# Patient Record
Sex: Female | Born: 1950
Health system: Southern US, Community
[De-identification: ages and names within clinical notes are randomized; demographics above are authoritative.]

## PROBLEM LIST (undated history)

## (undated) DIAGNOSIS — Z9289 Personal history of other medical treatment: Secondary | ICD-10-CM

## (undated) DIAGNOSIS — J45909 Unspecified asthma, uncomplicated: Secondary | ICD-10-CM

## (undated) DIAGNOSIS — N63 Unspecified lump in unspecified breast: Secondary | ICD-10-CM

## (undated) DIAGNOSIS — Z136 Encounter for screening for cardiovascular disorders: Secondary | ICD-10-CM

## (undated) DIAGNOSIS — I2510 Atherosclerotic heart disease of native coronary artery without angina pectoris: Secondary | ICD-10-CM

## (undated) DIAGNOSIS — J42 Unspecified chronic bronchitis: Secondary | ICD-10-CM

## (undated) HISTORY — DX: Encounter for screening for cardiovascular disorders: Z13.6

## (undated) HISTORY — PX: TUBAL LIGATION: SHX77

## (undated) HISTORY — DX: Personal history of other medical treatment: Z92.89

## (undated) HISTORY — DX: Atherosclerotic heart disease of native coronary artery without angina pectoris: I25.10

## (undated) HISTORY — DX: Unspecified lump in unspecified breast: N63.0

## (undated) HISTORY — DX: Unspecified asthma, uncomplicated: J45.909

---

## 2002-06-12 HISTORY — PX: CHOLECYSTECTOMY: SHX55

## 2004-04-21 ENCOUNTER — Ambulatory Visit: Payer: Self-pay | Admitting: Gastroenterology

## 2004-05-30 ENCOUNTER — Ambulatory Visit: Payer: Self-pay | Admitting: Chiropractic Medicine

## 2005-03-07 ENCOUNTER — Ambulatory Visit: Payer: Self-pay | Admitting: General Practice

## 2005-06-12 HISTORY — PX: SPINE SURGERY: SHX786

## 2005-06-28 ENCOUNTER — Encounter: Admission: RE | Admit: 2005-06-28 | Discharge: 2005-06-28 | Payer: Self-pay | Admitting: Neurosurgery

## 2005-07-13 ENCOUNTER — Encounter: Admission: RE | Admit: 2005-07-13 | Discharge: 2005-07-13 | Payer: Self-pay | Admitting: Neurosurgery

## 2005-08-04 ENCOUNTER — Ambulatory Visit: Payer: Self-pay | Admitting: Unknown Physician Specialty

## 2005-08-11 ENCOUNTER — Ambulatory Visit: Payer: Self-pay | Admitting: Surgery

## 2005-09-06 ENCOUNTER — Other Ambulatory Visit: Payer: Self-pay

## 2005-09-13 ENCOUNTER — Ambulatory Visit: Payer: Self-pay | Admitting: Surgery

## 2005-10-13 ENCOUNTER — Encounter: Admission: RE | Admit: 2005-10-13 | Discharge: 2005-10-13 | Payer: Self-pay | Admitting: Neurosurgery

## 2005-12-19 ENCOUNTER — Ambulatory Visit: Payer: Self-pay | Admitting: Neurosurgery

## 2006-01-09 ENCOUNTER — Ambulatory Visit (HOSPITAL_COMMUNITY): Admission: RE | Admit: 2006-01-09 | Discharge: 2006-01-10 | Payer: Self-pay | Admitting: Neurosurgery

## 2007-05-13 ENCOUNTER — Ambulatory Visit: Payer: Self-pay | Admitting: Unknown Physician Specialty

## 2007-06-13 DIAGNOSIS — Z136 Encounter for screening for cardiovascular disorders: Secondary | ICD-10-CM

## 2007-06-13 HISTORY — DX: Encounter for screening for cardiovascular disorders: Z13.6

## 2008-03-23 ENCOUNTER — Emergency Department: Payer: Self-pay | Admitting: Emergency Medicine

## 2008-12-17 ENCOUNTER — Ambulatory Visit: Payer: Self-pay | Admitting: Internal Medicine

## 2009-08-09 ENCOUNTER — Ambulatory Visit: Payer: Self-pay | Admitting: Internal Medicine

## 2009-10-20 ENCOUNTER — Ambulatory Visit: Payer: Self-pay | Admitting: Gastroenterology

## 2009-10-20 LAB — HM COLONOSCOPY

## 2009-11-02 LAB — HM COLONOSCOPY: HM Colonoscopy: NORMAL

## 2010-07-03 ENCOUNTER — Encounter: Payer: Self-pay | Admitting: Neurosurgery

## 2011-02-21 ENCOUNTER — Ambulatory Visit: Payer: Self-pay | Admitting: Internal Medicine

## 2011-02-22 LAB — HM COLONOSCOPY

## 2011-03-01 ENCOUNTER — Encounter: Payer: Self-pay | Admitting: Internal Medicine

## 2011-08-30 ENCOUNTER — Encounter: Payer: Self-pay | Admitting: Internal Medicine

## 2011-08-30 ENCOUNTER — Ambulatory Visit (INDEPENDENT_AMBULATORY_CARE_PROVIDER_SITE_OTHER): Payer: BC Managed Care – PPO | Admitting: Internal Medicine

## 2011-08-30 VITALS — BP 116/70 | HR 90 | Temp 97.9°F | Resp 16 | Ht 63.0 in | Wt 152.5 lb

## 2011-08-30 DIAGNOSIS — Z1211 Encounter for screening for malignant neoplasm of colon: Secondary | ICD-10-CM

## 2011-08-30 DIAGNOSIS — Z1239 Encounter for other screening for malignant neoplasm of breast: Secondary | ICD-10-CM

## 2011-08-30 DIAGNOSIS — Z6825 Body mass index (BMI) 25.0-25.9, adult: Secondary | ICD-10-CM

## 2011-08-30 DIAGNOSIS — E663 Overweight: Secondary | ICD-10-CM

## 2011-08-30 NOTE — Progress Notes (Signed)
Patient ID: Terri Andrade, female   DOB: 07-Nov-1950, 61 y.o.   MRN: [id number]   Patient Active Problem List  Diagnoses  . Screening for colon cancer  . Screening for breast cancer  . Overweight (BMI 25.0-29.9)  . Treadmill stress test negative for angina pectoris    Subjective:  CC:   Chief Complaint  Patient presents with  . Follow-up    needs labs    HPI:   Terri Andrade a 61 y.o. female who presents for followup for primary care.  She feels fine.  She sees me once a year.  Her last physical was in July 2012,  At which time no PAP was done.  She has a history of ASCUS in 2008 , followed by normal PAP 2009, 2010 and 2011.  She has a healthy diet but does not exercise regularly.  Denies joint pain , insomnia, bowel or bladder dysfunction, and mood changes.     Past Medical History  Diagnosis Date  . Treadmill stress test negative for angina pectoris 2009    Past Surgical History  Procedure Date  . Spine surgery 2007    Lumbar disk,  [name]  . Cholecystectomy 2004  . Tubal ligation          The following portions of the patient's history were reviewed and updated as appropriate: Allergies, current medications, and problem list.    Review of Systems:   12 Pt  review of systems was negative except those addressed in the HPI,     History   Social History  . Marital Status: Married    Spouse Name: N/A    Number of Children: N/A  . Years of Education: N/A   Occupational History  . Not on file.   Social History Main Topics  . Smoking status: Never Smoker   . Smokeless tobacco: Never Used  . Alcohol Use: Yes     social  . Drug Use: No  . Sexually Active: Not on file   Other Topics Concern  . Not on file   Social History Narrative  . No narrative on file    Objective:  BP 116/70  Pulse 90  Temp(Src) 97.9 F (36.6 C) (Oral)  Resp 16  Ht 5\' 3"  (1.6 m)  Wt 152 lb 8 oz (69.174 kg)  BMI 27.01 kg/m2  SpO2 98%  General appearance: alert,  cooperative and appears stated age Ears: normal TM's and external ear canals both ears Throat: lips, mucosa, and tongue normal; teeth and gums normal Neck: no adenopathy, no carotid bruit, supple, symmetrical, trachea midline and thyroid not enlarged, symmetric, no tenderness/mass/nodules Back: symmetric, no curvature. ROM normal. No CVA tenderness. Lungs: clear to auscultation bilaterally Heart: regular rate and rhythm, S1, S2 normal, no murmur, click, rub or gallop Abdomen: soft, non-tender; bowel sounds normal; no masses,  no organomegaly Pulses: 2+ and symmetric Skin: Skin color, texture, turgor normal. No rashes or lesions Lymph nodes: Cervical, supraclavicular, and axillary nodes normal.  Assessment and Plan:  Screening for colon cancer She had a normal colonoscopy  In May 2011  Screening for breast cancer She is up to date on mammograms.   Overweight (BMI 25.0-29.9) I have addressed  BMI and recommended a low glycemic index diet utilitzign smaller more frequent meals to aid metabolism.  I have alse recommended that she start exercisign with a goal of 30 minutes of aerovic exercise a minimum of 5 days per week.      Updated Medication List Outpatient  Encounter Prescriptions as of 08/30/2011  Medication Sig Dispense Refill  . aspirin 81 MG tablet Take 81 mg by mouth daily.      . Multiple Vitamin (MULTIVITAMIN) tablet Take 1 tablet by mouth daily.         Orders Placed This Encounter  Procedures  . HM PAP SMEAR  . TSH  . Lipid panel  . COMPLETE METABOLIC PANEL WITH GFR  . HM COLONOSCOPY  . HM COLONOSCOPY    Return in about 4 months (around 12/30/2011).

## 2011-08-31 ENCOUNTER — Encounter: Payer: Self-pay | Admitting: Internal Medicine

## 2011-08-31 DIAGNOSIS — Z1239 Encounter for other screening for malignant neoplasm of breast: Secondary | ICD-10-CM | POA: Insufficient documentation

## 2011-08-31 DIAGNOSIS — Z1211 Encounter for screening for malignant neoplasm of colon: Secondary | ICD-10-CM | POA: Insufficient documentation

## 2011-08-31 DIAGNOSIS — Z136 Encounter for screening for cardiovascular disorders: Secondary | ICD-10-CM | POA: Insufficient documentation

## 2011-08-31 DIAGNOSIS — E663 Overweight: Secondary | ICD-10-CM | POA: Insufficient documentation

## 2011-08-31 NOTE — Assessment & Plan Note (Signed)
She is up to date on mammograms.

## 2011-08-31 NOTE — Assessment & Plan Note (Addendum)
She had a normal colonoscopy  In May 2011

## 2011-08-31 NOTE — Assessment & Plan Note (Signed)
I have addressed  BMI and recommended a low glycemic index diet utilitzign smaller more frequent meals to aid metabolism.  I have alse recommended that she start exercisign with a goal of 30 minutes of aerovic exercise a minimum of 5 days per week.

## 2012-03-18 ENCOUNTER — Encounter: Payer: Self-pay | Admitting: Internal Medicine

## 2012-04-04 ENCOUNTER — Telehealth: Payer: Self-pay | Admitting: Internal Medicine

## 2012-04-04 DIAGNOSIS — Z23 Encounter for immunization: Secondary | ICD-10-CM

## 2012-04-04 NOTE — Telephone Encounter (Signed)
Pt called she need rx for shingle shot [facility] in [place]

## 2012-04-05 NOTE — Telephone Encounter (Signed)
Pt called back checking on rx

## 2012-04-05 NOTE — Telephone Encounter (Signed)
Phone today [phone]

## 2012-04-06 NOTE — Telephone Encounter (Signed)
Terri Andrade

## 2012-04-07 MED ORDER — ZOSTER VACCINE LIVE 19400 UNT/0.65ML ~~LOC~~ SOLR: 0.6500 mL | Freq: Once | SUBCUTANEOUS | Status: DC

## 2012-04-07 NOTE — Telephone Encounter (Signed)
rx for shingles vaccine sent to [facility] in [place]

## 2012-04-08 ENCOUNTER — Other Ambulatory Visit: Payer: Self-pay

## 2012-04-08 NOTE — Telephone Encounter (Signed)
Left message on patient vm advising that rx for shingles vaccine was sent to [facility].

## 2012-06-12 DIAGNOSIS — N63 Unspecified lump in unspecified breast: Secondary | ICD-10-CM

## 2012-06-12 HISTORY — DX: Unspecified lump in unspecified breast: N63.0

## 2012-10-10 ENCOUNTER — Telehealth: Payer: Self-pay | Admitting: Internal Medicine

## 2012-10-10 NOTE — Telephone Encounter (Signed)
Patient Information:  Caller Name: Terri Andrade  Phone: [phone]  Patient: Terri Andrade  Gender: Female  DOB: 11-May-1951  Age: 62 Years  PCP: Duncan Dull (Adults only)  Office Follow Up:  Does the office need to follow up with this patient?: No  Instructions For The Office: N/A   Symptoms  Reason For Call & Symptoms: Pt's huband/Randy states she has vomiting, diarrhea, headache, cramping.  Reviewed Health History In EMR: Yes  Reviewed Medications In EMR: Yes  Reviewed Allergies In EMR: Yes  Reviewed Surgeries / Procedures: Yes  Date of Onset of Symptoms: 10/10/2012  Guideline(s) Used:  Vomiting  Disposition Per Guideline:   Home Care  Reason For Disposition Reached:   Vomiting with diarrhea  Advice Given:  Reassurance:  Vomiting can be caused by many types of illnesses. It can be caused by a stomach flu virus. It can be caused by eating or drinking something that disagreed with your stomach.  Adults with vomiting need to stay hydrated. This is the most important thing. If you don't drink and replace lost fluids, you may get dehydrated.  You can treat vomiting, even if there is mild dehydration, at home.  Here is some care advice that should help.  Clear Liquids:  Sip water or a rehydration drink (e.g., Gatorade or Powerade).  Other options: 1/2 strength flat lemon-lime soda or ginger ale.  After 4 hours without vomiting, increase the amount.  For Non-stop Vomiting, Try Sleeping:  Try to go to sleep (Reason: sleep often empties the stomach and relieves the need to vomit).  When you awaken, resume drinking liquids. Water works best initially.  Avoid Nonprescription Medicines:  Stop all nonprescription medicines for 24 hours (Reason: they may make vomiting worse).  Contagiousness:  You can return to work or school after vomiting and fever are gone.  Expected Course:  Vomiting from viral gastritis usually stops in 12 to 48 hours.  If diarrhea is present, it usually lasts  for several days.  People with mild dehydration can usually treat themselves at home, by drinking more liquids.  People with moderate to severe dehydration may need medical care. signs of this include very dry mouth, dizziness, weakness, and decreased urination.  Call Back If:  Vomiting lasts for more than 2 days (48 hours)  Signs of dehydration occur  You become worse.  Patient Will Follow Care Advice:  YES

## 2012-12-24 ENCOUNTER — Ambulatory Visit (INDEPENDENT_AMBULATORY_CARE_PROVIDER_SITE_OTHER): Payer: BC Managed Care – PPO | Admitting: Internal Medicine

## 2012-12-24 ENCOUNTER — Other Ambulatory Visit: Payer: Self-pay | Admitting: Internal Medicine

## 2012-12-24 ENCOUNTER — Encounter: Payer: Self-pay | Admitting: Internal Medicine

## 2012-12-24 VITALS — BP 110/78 | HR 86 | Temp 98.7°F | Resp 12 | Wt 155.0 lb

## 2012-12-24 DIAGNOSIS — N6310 Unspecified lump in the right breast, unspecified quadrant: Secondary | ICD-10-CM

## 2012-12-24 DIAGNOSIS — N6459 Other signs and symptoms in breast: Secondary | ICD-10-CM

## 2012-12-24 DIAGNOSIS — N63 Unspecified lump in unspecified breast: Secondary | ICD-10-CM

## 2012-12-24 NOTE — Progress Notes (Signed)
Patient ID: Terri Andrade, female   DOB: 09-Feb-1951, 62 y.o.   MRN: [id number]  Patient Active Problem List   Diagnosis Date Noted  . Abnormal breast exam 12/25/2012  . Screening for colon cancer 08/31/2011  . Screening for breast cancer 08/31/2011  . Overweight (BMI 25.0-29.9) 08/31/2011  . Treadmill stress test negative for angina pectoris     Subjective:  CC:   Chief Complaint  Patient presents with  . Acute Visit    lump right breast looking at patient in 7 o'clock area.    HPI:   Terri Andrade a 62 y.o. female who presents with a breast mass.  Patient reports feeling a lump in her right breast during her weekly self breast exam.  Has been present for one week.  Nontender.  No recent scalp infection or URI.  Last mammogram was reported as normal by the [facility] in 2012.  She has no known risk factors ( no history of tobacco abuse,  Prior breast biopsy, use of HRT or FH of breast cancer).  Used oral contraceptives in her 20s to 62s .     Past Medical History  Diagnosis Date  . Treadmill stress test negative for angina pectoris 2009    Past Surgical History  Procedure Laterality Date  . Spine surgery  2007    Lumbar disk,  [name]  . Cholecystectomy  2004  . Tubal ligation      Family History  Problem Relation Age of Onset  . Birth defects Neg Hx   . Mental illness Mother     bipolar disorder  . Cancer Father 45    tobacco related CA  . Cancer Brother     tobacco related CA     The following portions of the patient's history were reviewed and updated as appropriate: Allergies, current medications, and problem list.    Review of Systems:  Patient denies headache, fevers, malaise, unintentional weight loss, skin rash, eye pain, sinus congestion and sinus pain, sore throat, dysphagia,  hemoptysis , cough, dyspnea, wheezing, chest pain, palpitations, orthopnea, edema, abdominal pain, nausea, melena, diarrhea, constipation, flank pain, dysuria,  hematuria, urinary  Frequency, nocturia, numbness, tingling, seizures,  Focal weakness, Loss of consciousness,  Tremor, insomnia, depression, anxiety, and suicidal ideation.     History   Social History  . Marital Status: Married    Spouse Name: N/A    Number of Children: N/A  . Years of Education: N/A   Occupational History  . Not on file.   Social History Main Topics  . Smoking status: Never Smoker   . Smokeless tobacco: Never Used  . Alcohol Use: Yes     Comment: social  . Drug Use: No  . Sexually Active: Not on file   Other Topics Concern  . Not on file   Social History Narrative  . No narrative on file    Objective:  BP 110/78  Pulse 86  Temp(Src) 98.7 F (37.1 C) (Oral)  Resp 12  Wt 155 lb (70.308 kg)  BMI 27.46 kg/m2  SpO2 98%  General appearance: alert, cooperative and appears stated age Ears: normal TM's and external ear canals both ears Throat: lips, mucosa, and tongue normal; teeth and gums normal Neck: no adenopathy, no carotid bruit, supple, symmetrical, trachea midline and thyroid not enlarged, symmetric, no tenderness/mass/nodules Breast: diffuse thickening of breast tissue, right breast 9:00 position adjacent to nipple. Left breast: normal  Lungs: clear to auscultation bilaterally Heart: regular rate and rhythm,  S1, S2 normal, no murmur, click, rub or gallop Abdomen: soft, non-tender; bowel sounds normal; no masses,  no organomegaly Pulses: 2+ and symmetric Skin: Skin color, texture, turgor normal. No rashes or lesions Lymph nodes: Cervical, supraclavicular, and axillary nodes normal.  Assessment and Plan:  Abnormal breast exam Her exam today is notable for an area of thickened breast tissue in the right breast just lateral to her nipple at the 9:00 position.  She has not had a mammogram since 2012. I have spoken with Dr. Lemar Livings who will work her in for an evaluation today along with diagnostics as he sees fit.    Updated Medication  List Outpatient Encounter Prescriptions as of 12/24/2012  Medication Sig Dispense Refill  . zoster vaccine live, PF, (ZOSTAVAX) 16109 UNT/0.65ML injection Inject 19,400 Units into the skin once.  1 vial  0  . aspirin 81 MG tablet Take 81 mg by mouth daily.      . Multiple Vitamin (MULTIVITAMIN) tablet Take 1 tablet by mouth daily.       No facility-administered encounter medications on file as of 12/24/2012.     Orders Placed This Encounter  Procedures  . MM Digital Diagnostic Bilat    No Follow-up on file.

## 2012-12-25 ENCOUNTER — Encounter: Payer: Self-pay | Admitting: Internal Medicine

## 2012-12-25 ENCOUNTER — Telehealth: Payer: Self-pay | Admitting: Internal Medicine

## 2012-12-25 ENCOUNTER — Inpatient Hospital Stay
Admission: RE | Admit: 2012-12-25 | Discharge: 2012-12-25 | Disposition: A | Discharge status: Home / Self Care | Payer: Self-pay | Source: Ambulatory Visit | Attending: General Surgery | Admitting: General Surgery

## 2012-12-25 ENCOUNTER — Encounter: Payer: Self-pay | Admitting: General Surgery

## 2012-12-25 ENCOUNTER — Ambulatory Visit (INDEPENDENT_AMBULATORY_CARE_PROVIDER_SITE_OTHER): Payer: BC Managed Care – PPO | Admitting: General Surgery

## 2012-12-25 VITALS — BP 130/70 | HR 76 | Resp 12 | Ht 63.0 in | Wt 152.0 lb

## 2012-12-25 DIAGNOSIS — N6459 Other signs and symptoms in breast: Secondary | ICD-10-CM | POA: Insufficient documentation

## 2012-12-25 DIAGNOSIS — N63 Unspecified lump in unspecified breast: Secondary | ICD-10-CM

## 2012-12-25 HISTORY — PX: BREAST BIOPSY: SHX20

## 2012-12-25 NOTE — Patient Instructions (Addendum)

## 2012-12-25 NOTE — Assessment & Plan Note (Addendum)
Her exam today is notable for an area of thickened breast tissue in the right breast just lateral to her nipple at the 9:00 position.  She has not had a mammogram since 2012. I have spoken with Dr. Lemar Livings who will work her in for an evaluation today along with diagnostics as he sees fit.

## 2012-12-25 NOTE — Telephone Encounter (Signed)
Pt called checking about her referral to surgeon or diagnostics test. Pt has her cd of mammogram in hand Please call pt ASAP!!

## 2012-12-25 NOTE — Telephone Encounter (Signed)
Called patient to make her aware that [facility] will see her this afternoon

## 2012-12-25 NOTE — Progress Notes (Signed)
Patient ID: Terri Andrade, female   DOB: 1950-08-31, 62 y.o.   MRN: [id number]  Chief Complaint  Patient presents with  . Other    right breast     HPI Terri Andrade is a 62 y.o. female here for a right breast mass/thickening 9 o'clock. Seen by Dr Darrick Huntsman for this yesterday. Patient discovered this herself 14 days ago. No pain or other breast symptoms to report.  HPI  Past Medical History  Diagnosis Date  . Treadmill stress test negative for angina pectoris 2009    Past Surgical History  Procedure Laterality Date  . Spine surgery  2007    Lumbar disk,  [name]  . Cholecystectomy  2004  . Tubal ligation      Family History  Problem Relation Age of Onset  . Birth defects Neg Hx   . Mental illness Mother     bipolar disorder  . Cancer Father 18    tobacco related [place]  . Cancer Brother     tobacco related [place]    Social History History  Substance Use Topics  . Smoking status: Never Smoker   . Smokeless tobacco: Never Used  . Alcohol Use: Yes     Comment: social    No Known Allergies  No current outpatient prescriptions on file.   No current facility-administered medications for this visit.    Review of Systems Review of Systems  Constitutional: Negative.   Respiratory: Negative.   Cardiovascular: Negative.     Blood pressure 130/70, pulse 76, resp. rate 12, height 5\' 3"  (1.6 m), weight 152 lb (68.947 kg).  Physical Exam Physical Exam  Constitutional: She is oriented to person, place, and time. She appears well-developed and well-nourished.  Neck: Neck supple.  Cardiovascular: Normal rate, regular rhythm and normal heart sounds.   Pulmonary/Chest: Effort normal and breath sounds normal. Right breast exhibits no inverted nipple, no mass, no nipple discharge, no skin change (focal thickening at 9o'clock position) and no tenderness. Left breast exhibits no inverted nipple, no mass, no nipple discharge, no skin change and no tenderness.  Lymphadenopathy:    She has no  cervical adenopathy.  Neurological: She is alert and oriented to person, place, and time.    Data Reviewed PROCEDURE: MAM - MAM DGTL SCRN MAM NO ORDER W/CAD  - Feb 21 2011  3:42PM   RESULT: Comparison is made to study of 17 December 2008, as well as 13 May 2007 and 19 December 2001.   The breasts exhibit a moderately dense parenchymal pattern. There is no  dominant mass. There are no malignant appearing groupings of  microcalcification. There are scattered coarse microcalcifications and  macrocalcifications in both breasts.   IMPRESSION:   1.I do not see findings suspicious for malignancy.   BI-RADS: Category 2 - Benign Findings    RECOMMENDATIONS:   1. Please continue to encourage yearly mammographic follow-up.  Marland Kitchen  Ultrasound examination of the right breast in the 9:00 position, 9 cm from the nipple in the area of palpable thickening showed a focal hypoechoic area with acoustic shadowing. This measured 1.1 x 1.31 x 1.37 cm. This was fairly irregular.  The patient was amenable to vacuum assisted biopsy. The skin was prepped with alcohol and 10 cc of 0.5% Xylocaine with 0.25% Marcaine with 1 200,000 units of epinephrine was utilized well tolerated. ChloraPrep was applied to the skin. 8 core samples were obtained with a Encor 10-gauge device. A postbiopsy clip was placed. Skin defect was closed with  benzoin and Steri-Strips followed by Telfa and Tegaderm dressing. The procedure was well tolerated.    Assessment    Focal thickening right breast, abnormal ultrasound.    Plan    The patient will be contacted when the biopsy results are available. We'll arrange for mammograms in her future.       [name] 12/25/2012, 9:47 PM

## 2012-12-25 NOTE — Addendum Note (Signed)
Addended by: Sherlene Shams on: 12/25/2012 01:18 PM   Modules accepted: Orders

## 2012-12-27 ENCOUNTER — Encounter: Payer: Self-pay | Admitting: Internal Medicine

## 2012-12-27 ENCOUNTER — Encounter: Payer: Self-pay | Admitting: General Surgery

## 2012-12-27 ENCOUNTER — Telehealth: Payer: Self-pay | Admitting: Internal Medicine

## 2012-12-27 ENCOUNTER — Telehealth: Payer: Self-pay | Admitting: General Surgery

## 2012-12-27 NOTE — Telephone Encounter (Signed)
Message left that biopsy results were benign.

## 2012-12-29 NOTE — Telephone Encounter (Signed)
Thank you.  And thank you for getting her in so quickly.

## 2012-12-30 LAB — PATHOLOGY

## 2012-12-31 ENCOUNTER — Telehealth: Payer: Self-pay | Admitting: *Deleted

## 2012-12-31 NOTE — Telephone Encounter (Signed)
Patient called and left a message with the answering service on 12-27-12 at 3:06 pm. She was calling wanting to know the results from breast biopsy. Dr. Lemar Livings had called and left a message for patient with these results on 12-27-12 at 6:47 pm.  Message was left for patient to call the office to verify that she received results. Patient never called back yesterday to confirm.

## 2013-01-01 ENCOUNTER — Encounter: Payer: Self-pay | Admitting: *Deleted

## 2013-01-01 ENCOUNTER — Telehealth: Payer: Self-pay | Admitting: *Deleted

## 2013-01-01 ENCOUNTER — Ambulatory Visit (INDEPENDENT_AMBULATORY_CARE_PROVIDER_SITE_OTHER): Payer: BC Managed Care – PPO | Admitting: *Deleted

## 2013-01-01 ENCOUNTER — Encounter: Payer: Self-pay | Admitting: General Surgery

## 2013-01-01 DIAGNOSIS — Z1231 Encounter for screening mammogram for malignant neoplasm of breast: Secondary | ICD-10-CM

## 2013-01-01 DIAGNOSIS — N63 Unspecified lump in unspecified breast: Secondary | ICD-10-CM

## 2013-01-01 NOTE — Patient Instructions (Addendum)
Patient to follow up in 1 month with Dr. Darrick Huntsman.

## 2013-01-01 NOTE — Telephone Encounter (Signed)
Patient was left a message regarding mammogram that has been scheduled for 02-04-13 at 8 am [facility]). My Chart message was also sent. She was placed in September 2014 recalls for office visit follow up.

## 2013-01-01 NOTE — Progress Notes (Signed)
Patient here today for follow up post breast biopsy.  Dressing removed, steristrip in place and aware it may come off in one week.  Minimal bruising noted.  The patient is aware that a heating pad may be used for comfort as needed.  Aware of pathology. Follow up as scheduled.  

## 2013-01-01 NOTE — Telephone Encounter (Signed)
Message copied by Nicholes Mango on Wed Jan 01, 2013  9:58 AM ------      Message from: [place], Merrily Pew      Created: Tue Dec 31, 2012  3:40 PM       Please arrange for bilateral screening mammograms in 1 months w/ OV to follow. Thanks.  ------

## 2013-01-03 ENCOUNTER — Other Ambulatory Visit: Payer: BC Managed Care – PPO

## 2013-02-04 ENCOUNTER — Ambulatory Visit: Payer: Self-pay | Admitting: General Surgery

## 2013-02-07 ENCOUNTER — Encounter: Payer: Self-pay | Admitting: General Surgery

## 2013-02-17 ENCOUNTER — Ambulatory Visit (INDEPENDENT_AMBULATORY_CARE_PROVIDER_SITE_OTHER): Payer: BC Managed Care – PPO | Admitting: General Surgery

## 2013-02-17 ENCOUNTER — Ambulatory Visit: Payer: BC Managed Care – PPO | Admitting: General Surgery

## 2013-02-17 ENCOUNTER — Encounter: Payer: Self-pay | Admitting: General Surgery

## 2013-02-17 VITALS — BP 138/76 | HR 78 | Resp 12 | Ht 63.0 in | Wt 158.0 lb

## 2013-02-17 DIAGNOSIS — Z1231 Encounter for screening mammogram for malignant neoplasm of breast: Secondary | ICD-10-CM

## 2013-02-17 DIAGNOSIS — N6459 Other signs and symptoms in breast: Secondary | ICD-10-CM

## 2013-02-17 NOTE — Patient Instructions (Addendum)
Patient to return in 3 months. She is advised to can use heat on the area of thickening in the right breast.

## 2013-02-17 NOTE — Progress Notes (Signed)
Patient ID: Terri Andrade, female   DOB: [birth year], 62 y.o.   MRN: [id number]  Chief Complaint  Patient presents with  . Follow-up    mammogram    HPI Terri Andrade is a 62 y.o. female who presents for a follow up breast evaluation. The most recent mammogram was done on 02/04/13 with a birad category 0. No new problems with the breasts. She states the thickening and discoloration from before are still present.  Biopsy completed at the time of her recent visit showed only proliferative changes without atypia or malignancy.  The area of discoloration and slight thickening in the 8:00 position of the right breast is unchanged compared to past exams by the patient's report. She once again denies any history of trauma to the area. HPI  Past Medical History  Diagnosis Date  . Treadmill stress test negative for angina pectoris 2009  . Lump or mass in breast 2014    Past Surgical History  Procedure Laterality Date  . Spine surgery  2007    Lumbar disk,  Terri Andrade  . Cholecystectomy  2004  . Tubal ligation    . Breast biopsy Right 2014    Family History  Problem Relation Age of Onset  . Birth defects Neg Hx   . Mental illness Mother     bipolar disorder  . Cancer Father 40    tobacco related [place]  . Cancer Brother     tobacco related [place]    Social History History  Substance Use Topics  . Smoking status: Never Smoker   . Smokeless tobacco: Never Used  . Alcohol Use: Yes     Comment: social    No Known Allergies  No current outpatient prescriptions on file.   No current facility-administered medications for this visit.    Review of Systems Review of Systems  Constitutional: Negative.   Respiratory: Negative.   Cardiovascular: Negative.     Blood pressure 138/76, pulse 78, resp. rate 12, height 5\' 3"  (1.6 m), weight 158 lb (71.668 kg).  Physical Exam Physical Exam  Constitutional: She is oriented to person, place, and time. She appears well-developed and well-nourished.   Neck: No thyromegaly present.  Cardiovascular: Normal rate, regular rhythm and normal heart sounds.   No murmur heard. Pulmonary/Chest: Effort normal and breath sounds normal. Right breast exhibits no inverted nipple, no mass, no nipple discharge, no skin change and no tenderness. Left breast exhibits no inverted nipple, no mass, no nipple discharge, no skin change and no tenderness.    3 cm area of discoloration in the right 8:00 position 7 cm from nipple.  No skin breaks, erythema or inflammatory changes are appreciated. The appearance is that of a long healing bruise.  Lymphadenopathy:    She has no cervical adenopathy.    She has no axillary adenopathy.  Neurological: She is alert and oriented to person, place, and time.  Skin: Skin is warm and dry.    Data Reviewed Core biopsy completed August 2014: Diagnosis: RIGHT BREAST 9:00 CORE BIOPSY *STAT*: - BENIGN BREAST TISSUE WITH FOCAL COLUMNAR CELL CHANGE, USUAL DUCTAL HYPERPLASIA, AND FOCAL CHANGES SUGGESTIVE OF COLLAPSED CYST. - NEGATIVE FOR ATYPIA AND MALIGNANCY. - DEEPER SECTIONS WERE EXAMINED.   Ultrasound examination was completed to assess the skin thickness. In the area of faint discoloration, brownish/10) the skin is approximately 0.26 cm in thickness, this is 7 cm from the nipple. Superior to this at the 9:00 position the skin thickness is 0.17 cm. No underlying abnormalities  appreciated in the breast parenchyma.  Mammograms dated 02/04/2013 showed no abnormality in the left breast. A nodular densities appreciated in the deep portion of the right breast with a biopsy clip within the center of it.  Assessment    Stable breast exam, unexplained focal thickening.    Plan    I don't think a skin biopsy is shed any light on the process, and at this time continued observation is warranted. The mass appreciated by the radiologist has been biopsied and is benign. A follow up examination in 3 months has been recommended.        Terri Andrade 02/18/2013, 8:55 AM

## 2013-02-18 ENCOUNTER — Encounter: Payer: Self-pay | Admitting: General Surgery

## 2013-02-25 ENCOUNTER — Encounter: Payer: Self-pay | Admitting: General Surgery

## 2013-03-04 ENCOUNTER — Other Ambulatory Visit (HOSPITAL_COMMUNITY)
Admission: RE | Admit: 2013-03-04 | Discharge: 2013-03-04 | Disposition: A | Discharge status: Home / Self Care | Payer: BC Managed Care – PPO | Source: Ambulatory Visit | Attending: Internal Medicine | Admitting: Internal Medicine

## 2013-03-04 ENCOUNTER — Ambulatory Visit (INDEPENDENT_AMBULATORY_CARE_PROVIDER_SITE_OTHER): Payer: BC Managed Care – PPO | Admitting: Internal Medicine

## 2013-03-04 ENCOUNTER — Encounter: Payer: Self-pay | Admitting: Internal Medicine

## 2013-03-04 VITALS — BP 122/74 | HR 76 | Temp 98.5°F | Resp 14 | Ht 63.75 in | Wt 156.5 lb

## 2013-03-04 DIAGNOSIS — N63 Unspecified lump in unspecified breast: Secondary | ICD-10-CM

## 2013-03-04 DIAGNOSIS — Z0000 Encounter for general adult medical examination without abnormal findings: Secondary | ICD-10-CM

## 2013-03-04 DIAGNOSIS — Z01419 Encounter for gynecological examination (general) (routine) without abnormal findings: Secondary | ICD-10-CM | POA: Insufficient documentation

## 2013-03-04 DIAGNOSIS — Z1151 Encounter for screening for human papillomavirus (HPV): Secondary | ICD-10-CM | POA: Insufficient documentation

## 2013-03-04 DIAGNOSIS — Z1211 Encounter for screening for malignant neoplasm of colon: Secondary | ICD-10-CM

## 2013-03-04 DIAGNOSIS — Z124 Encounter for screening for malignant neoplasm of cervix: Secondary | ICD-10-CM

## 2013-03-04 DIAGNOSIS — R5381 Other malaise: Secondary | ICD-10-CM

## 2013-03-04 DIAGNOSIS — E785 Hyperlipidemia, unspecified: Secondary | ICD-10-CM

## 2013-03-04 DIAGNOSIS — Z23 Encounter for immunization: Secondary | ICD-10-CM

## 2013-03-04 DIAGNOSIS — Z6825 Body mass index (BMI) 25.0-25.9, adult: Secondary | ICD-10-CM

## 2013-03-04 DIAGNOSIS — E663 Overweight: Secondary | ICD-10-CM

## 2013-03-04 DIAGNOSIS — Z1239 Encounter for other screening for malignant neoplasm of breast: Secondary | ICD-10-CM

## 2013-03-04 DIAGNOSIS — E559 Vitamin D deficiency, unspecified: Secondary | ICD-10-CM

## 2013-03-04 LAB — COMPREHENSIVE METABOLIC PANEL
AST: 23 U/L (ref 0–37)
Creatinine, Ser: 0.8 mg/dL (ref 0.4–1.2)
GFR: 73.33 mL/min (ref >60.00)
Glucose, Bld: 84 mg/dL (ref 70–99)
Potassium: 4.7 mEq/L (ref 3.5–5.1)
Sodium: 140 mEq/L (ref 135–145)
Total Bilirubin: 0.5 mg/dL (ref 0.3–1.2)
Total Protein: 7.6 g/dL (ref 6.0–8.3)

## 2013-03-04 LAB — CBC WITH DIFFERENTIAL/PLATELET
Basophils Absolute: 0 10*3/uL (ref 0.0–0.1)
Eosinophils Absolute: 0.3 10*3/uL (ref 0.0–0.7)
Eosinophils Relative: 3.9 % (ref 0.0–5.0)
Lymphs Abs: 2 10*3/uL (ref 0.7–4.0)
MCHC: 34.4 g/dL (ref 30.0–36.0)
Monocytes Relative: 6.5 % (ref 3.0–12.0)
Neutro Abs: 4.1 10*3/uL (ref 1.4–7.7)
Neutrophils Relative %: 59.5 % (ref 43.0–77.0)
WBC: 6.9 10*3/uL (ref 4.5–10.5)

## 2013-03-04 LAB — LIPID PANEL
Total CHOL/HDL Ratio: 5
Triglycerides: 228 mg/dL — ABNORMAL HIGH (ref 0.0–149.0)
VLDL: 45.6 mg/dL — ABNORMAL HIGH (ref 0.0–40.0)

## 2013-03-04 NOTE — Assessment & Plan Note (Signed)
Normal Dr Terri Andrade  2012.

## 2013-03-04 NOTE — Assessment & Plan Note (Signed)
Up to date, now managed by Adela Glimpse since biopsy of right breast in July

## 2013-03-04 NOTE — Assessment & Plan Note (Signed)
Annual comprehensive exam was done including breast, pelvic and PAP smear. All screenings have been addressed .  

## 2013-03-04 NOTE — Patient Instructions (Addendum)
You had your annual  wellness exam today  You received the  TDaP vaccine today  (your nexst tetanus shot is due at age 62)  I recommend that you return for the Prevnar vaccine to pneumonia prophylaxis when it is available   We will contact you with the bloodwork results  Your first goal with weight loss is 143 lbs to get BMI < 25 (the cutoff for "overweight" status based on WHO : world health organization)  This is  my version of a  "Low GI"  Diet:  It will allow you to lose 4 to 8  lbs  per month if you follow it carefully.  Your goal with exercise is a minimum of 30 minutes of aerobic exercise 5 days per week (Walking does not count unless you are walking fast and unable to sing  While walking )    All of the foods can be found at grocery stores and in bulk at [employer].  The Atkins protein bars and shakes are available in more varieties at Target, WalMart and [employer]'s Foods.     7 AM Breakfast:  Choose from the following:  Low carbohydrate Protein  Shakes (I recommend the EAS AdvantEdge "Carb Control" shakes  Or the low carb shakes by Atkins.    2.5 carbs   Arnold's "Sandwhich Thin"toasted  w/ peanut butter (no jelly: about 20 net carbs  "Bagel Thin" with cream cheese and salmon: about 20 carbs   a scrambled egg/bacon/cheese burrito made with Mission's "carb balance" whole wheat tortilla  (about 10 net carbs )   Avoid cereal and bananas, oatmeal and cream of wheat and grits. They are loaded with carbohydrates!   10 AM: high protein snack  Protein bar by Atkins (the snack size, under 200 cal, usually < 6 net carbs).    A stick of cheese:  Around 1 carb,  100 cal     Dannon Light n Fit [place] Yogurt  (80 cal, 8 carbs)  Other so called "protein bars" and Greek yogurts tend to be loaded with carbohydrates.  Remember, in food advertising, the word "energy" is synonymous for " carbohydrate."  Lunch:   A Sandwich using the bread choices listed, Can use any  Eggs,  lunchmeat, grilled meat or  canned tuna), avocado, regular mayo/mustard  and cheese.  A Salad using blue cheese, ranch,  Goddess or vinagrette,  No croutons or "confetti" and no "candied nuts" but regular nuts OK.   No pretzels or chips.  Pickles and miniature sweet peppers are a good low carb alternative that provide a "crunch"  The bread is the only source of carbohydrate in a sandwich and  can be decreased by trying some of these alternatives to traditional loaf bread  Joseph's makes a pita bread and a flat bread that are 50 cal and 4 net carbs available at BJs and WalMart.  This can be toasted to use with hummous as well  Toufayan makes a low carb flatbread that's 100 cal and 9 net carbs available at [employer] and [employer] makes 2 sizes of  Low carb whole wheat tortilla  (The large one is 210 cal and 6 net carbs) Avoid "Low fat dressings, as well as Reyne Dumas and [address] dressings They are loaded with sugar!   3 PM/ Mid day  Snack:  Consider  1 ounce of  almonds, walnuts, pistachios, pecans, peanuts,  Macadamia nuts or a nut medley.  Avoid "granola"; the dried cranberries and  raisins are loaded with carbohydrates. Mixed nuts as long as there are no raisins,  cranberries or dried fruit.     6 PM  Dinner:     Meat/fowl/fish with a green salad, and either broccoli, cauliflower, green beans, spinach, brussel sprouts or  Lima beans. DO NOT BREAD THE PROTEIN!!      There is a low carb pasta by Dreamfield's that is acceptable and tastes great: only 5 digestible carbs/serving.( All grocery stores but BJs carry it )  Try Kai Levins Angelo's chicken piccata or chicken or eggplant parm over low carb pasta.(Lowes and BJs)   Clifton Custard Sanchez's "Carnitas" (pulled pork, no sauce,  0 carbs) or his beef pot roast to make a dinner burrito (at BJ's)  Pesto over low carb pasta (bj's sells a good quality pesto in the center refrigerated section of the deli   Whole wheat pasta is still full of digestible carbs and  Not as low in  glycemic index as Dreamfield's.   Brown rice is still rice,  So skip the rice and noodles if you eat [place] or [place] (or at least limit to 1/2 cup)  9 PM snack :   Breyer's "low carb" fudgsicle or  ice cream bar (Carb Smart line), or  Weight Watcher's ice cream bar , or another "no sugar added" ice cream;  a serving of fresh berries/cherries with whipped cream   Cheese or DANNON'S LlGHT N FIT [place] YOGURT  Avoid bananas, pineapple, grapes  and watermelon on a regular basis because they are high in sugar.  THINK OF THEM AS DESSERT  Remember that snack Substitutions should be less than 10 NET carbs per serving and meals < 20 carbs. Remember to subtract fiber grams to get the "net carbs."

## 2013-03-04 NOTE — Assessment & Plan Note (Signed)
Benign, by biopsy

## 2013-03-04 NOTE — Assessment & Plan Note (Signed)
I have addressed  BMI and recommended wt loss of 10% of body weigh over the next 6 months using a low glycemic index diet and regular exercise a minimum of 5 days per week.   

## 2013-03-04 NOTE — Progress Notes (Signed)
Patient ID: Terri Andrade, female   DOB: May 10, 1951, 62 y.o.   MRN: [id number]     Subjective:     Terri Andrade is a 62 y.o. female and is here for a comprehensive physical exam. The patient reports Fatigue.  For the last 6 months has been falling asleep by 8:30 am. Dogs wake her up at 4:30 am to go out,  Goes back to sleep until 6 am.  No snoring reported. Wakes up feeling tired.  No daytime hyersomnolence.  She underwent right breast biopsy in July by Adela Glimpse for evaluation of a palpable breast mass on the right    Path was nonmalignant  Last PAP 2011   History   Social History  . Marital Status: Married    Spouse Name: N/A    Number of Children: N/A  . Years of Education: N/A   Occupational History  . Not on file.   Social History Main Topics  . Smoking status: Never Smoker   . Smokeless tobacco: Never Used  . Alcohol Use: Yes     Comment: social  . Drug Use: No  . Sexual Activity: Not on file   Other Topics Concern  . Not on file   Social History Narrative  . No narrative on file   Health Maintenance  Topic Date Due  . Tetanus/tdap  06/20/1971  . Zostavax  06/19/2012  . Pap Smear  12/30/2012  . Influenza Vaccine  01/10/2013  . Mammogram  02/05/2015  . Colonoscopy  03/01/2021    The following portions of the patient's history were reviewed and updated as appropriate: allergies, current medications, past family history, past medical history, past social history, past surgical history and problem list.  Review of Systems A comprehensive review of systems was negative.   Objective:   BP 122/74  Pulse 76  Temp(Src) 98.5 F (36.9 C) (Oral)  Resp 14  Ht 5' 3.75" (1.619 m)  Wt 156 lb 8 oz (70.988 kg)  BMI 27.08 kg/m2  SpO2 99%  General Appearance:    Alert, cooperative, no distress, appears stated age  Head:    Normocephalic, without obvious abnormality, atraumatic  Eyes:    PERRL, conjunctiva/corneas clear, EOM's intact, fundi    benign, both eyes   Ears:    Normal TM's and external ear canals, both ears  Nose:   Nares normal, septum midline, mucosa normal, no drainage    or sinus tenderness  Throat:   Lips, mucosa, and tongue normal; teeth and gums normal  Neck:   Supple, symmetrical, trachea midline, no adenopathy;    thyroid:  no enlargement/tenderness/nodules; no carotid   bruit or JVD  Back:     Symmetric, no curvature, ROM normal, no CVA tenderness  Lungs:     Clear to auscultation bilaterally, respirations unlabored  Chest Wall:    No tenderness or deformity   Heart:    Regular rate and rhythm, S1 and S2 normal, no murmur, rub   or gallop  Breast Exam:    No tenderness, masses, or nipple abnormality  Abdomen:     Soft, non-tender, bowel sounds active all four quadrants,    no masses, no organomegaly  Genitalia:    Pelvic: cervix normal in appearance, external genitalia normal, no adnexal masses or tenderness, no cervical motion tenderness, rectovaginal septum normal, uterus normal size, shape, and consistency and vagina normal without discharge  Extremities:   Extremities normal, atraumatic, no cyanosis or edema  Pulses:   2+ and symmetric all  extremities  Skin:   Skin color, texture, turgor normal, no rashes or lesions  Lymph nodes:   Cervical, supraclavicular, and axillary nodes normal  Neurologic:   CNII-XII intact, normal strength, sensation and reflexes    throughout      Assessment:   Overweight (BMI 25.0-29.9) I have addressed  BMI and recommended wt loss of 10% of body weigh over the next 6 months using a low glycemic index diet and regular exercise a minimum of 5 days per week.    Screening for breast cancer Up to date, now managed by Adela Glimpse since biopsy of right breast in July   Lump or mass in breast Benign, by biopsy   Screening for colon cancer Normal Dr Bluford Kaufmann  2012.  Other malaise and fatigue Screening for anemia,  Thyroid and diabetes   Routine general medical examination at a health care  facility Annual comprehensive exam was done including breast, pelvic and PAP smear. All screenings have been addressed .    Updated Medication List No outpatient encounter prescriptions on file as of 03/04/2013.   No facility-administered encounter medications on file as of 03/04/2013.

## 2013-03-04 NOTE — Addendum Note (Signed)
Addended by: Dennie Bible on: 03/04/2013 04:15 PM   Modules accepted: Orders

## 2013-03-04 NOTE — Assessment & Plan Note (Signed)
Screening for anemia,  Thyroid and diabetes

## 2013-03-05 LAB — VITAMIN D 25 HYDROXY (VIT D DEFICIENCY, FRACTURES): Vit D, 25-Hydroxy: 30 ng/mL (ref 30–89)

## 2013-03-06 ENCOUNTER — Encounter: Payer: Self-pay | Admitting: Internal Medicine

## 2013-03-06 DIAGNOSIS — Z8719 Personal history of other diseases of the digestive system: Secondary | ICD-10-CM | POA: Insufficient documentation

## 2013-03-06 DIAGNOSIS — Z9889 Other specified postprocedural states: Secondary | ICD-10-CM

## 2013-03-06 NOTE — Addendum Note (Signed)
Addended by: Sherlene Shams on: 03/06/2013 05:53 AM   Modules accepted: Orders

## 2013-06-17 ENCOUNTER — Encounter: Payer: Self-pay | Admitting: General Surgery

## 2013-06-17 ENCOUNTER — Other Ambulatory Visit: Payer: BC Managed Care – PPO

## 2013-06-17 ENCOUNTER — Ambulatory Visit (INDEPENDENT_AMBULATORY_CARE_PROVIDER_SITE_OTHER): Payer: BC Managed Care – PPO | Admitting: General Surgery

## 2013-06-17 VITALS — BP 118/74 | HR 72 | Resp 12 | Ht 64.0 in | Wt 158.0 lb

## 2013-06-17 DIAGNOSIS — N63 Unspecified lump in unspecified breast: Secondary | ICD-10-CM

## 2013-06-17 DIAGNOSIS — R1900 Intra-abdominal and pelvic swelling, mass and lump, unspecified site: Secondary | ICD-10-CM

## 2013-06-17 NOTE — Patient Instructions (Addendum)
Continue self breast exams. Call office for any new breast issues or concerns. May use a heating pad to abdomen  for comfort

## 2013-06-17 NOTE — Progress Notes (Signed)
Patient ID: Terri Andrade, female   DOB: [birth year], 63 y.o.   MRN: [id number]  Chief Complaint  Patient presents with  . Follow-up    HPI Terri Andrade is a 63 y.o. female.  who presents for her follow up breast evaluation. The most recent mammogram was done on 02-04-13.  Patient does perform regular self breast checks and gets regular mammograms done.  No new breast issues.  States the right breast still itches. She did notice a knot upper right abdomen about 5 months ago, no pain and no change in size.  HPI  Past Medical History  Diagnosis Date  . Treadmill stress test negative for angina pectoris 2009  . Lump or mass in breast 2014    Past Surgical History  Procedure Laterality Date  . Spine surgery  2007    Lumbar disk,  [name]  . Cholecystectomy  2004  . Tubal ligation    . Breast biopsy Right 12-25-2012     BENIGN BREAST TISSUE WITH FOCAL COLUMNAR CELL CHANGE, USUAL    Family History  Problem Relation Age of Onset  . Birth defects Neg Hx   . Mental illness Mother     bipolar disorder  . Cancer Father 9    tobacco related [place]  . Cancer Brother     tobacco related [place]    Social History History  Substance Use Topics  . Smoking status: Never Smoker   . Smokeless tobacco: Never Used  . Alcohol Use: Yes     Comment: social    No Known Allergies  No current outpatient prescriptions on file.   No current facility-administered medications for this visit.    Review of Systems Review of Systems  Constitutional: Negative.   Respiratory: Negative.   Cardiovascular: Negative.     Blood pressure 118/74, pulse 72, resp. rate 12, height 5\' 4"  (1.626 m), weight 158 lb (71.668 kg).  Physical Exam Physical Exam  Constitutional: She is oriented to person, place, and time. She appears well-developed and well-nourished.  Eyes: No scleral icterus.  Neck: Neck supple.  Cardiovascular: Normal rate, regular rhythm and normal heart sounds.   Pulmonary/Chest: Effort normal  and breath sounds normal. Right breast exhibits no inverted nipple, no mass, no nipple discharge, no skin change and no tenderness. Left breast exhibits no inverted nipple, no mass, no nipple discharge, no skin change and no tenderness.  Skin thickening 7-10 o'clock position right breast at old hematoma site, 6 x 8 cm.   Abdominal: Soft. Normal appearance.  Upper abdomen 2 cm off midline to right there is a 2 cm area of focal dermal thickening. Local skin discoloration.   Lymphadenopathy:    She has no cervical adenopathy.    She has no axillary adenopathy.  Neurological: She is alert and oriented to person, place, and time.  Skin: Skin is warm and dry.    Data Reviewed July 2014 biopsy: Focal columnar cell hyperplasia.  Ultrasound examination of the abdominal wall mass showed an isoechoic sluggishly lobulated heterogeneous lesion in the tissue just below the dermis measuring 0.9 x 1.1 x 1.33 cm. No increased vascular flow appreciated on duplex imaging. Consistent with a lipoma. Assessment    Resolution of post hematoma changes with mild residual skin thickening of the right lateral breast.  Right abdominal wall lipoma.     Plan    The patient will refrain from manipulation of the abdominal wall mass and she has been doing regularly. He will be checked bluntly during  her self-examination of her breast. If it enlarges we'll plan for early followup, otherwise bilateral screening mammograms in August 2015 with office visit to follow.        Robert Bellow 06/17/2013, 8:56 PM

## 2013-07-02 ENCOUNTER — Telehealth: Payer: Self-pay | Admitting: Internal Medicine

## 2013-07-02 NOTE — Telephone Encounter (Signed)
Pt called wanting to get a refill on an inhaler for asthma   Pt was sure name of med But she has taken this before Offered pt an appointment  Pt [facility]

## 2013-07-03 NOTE — Telephone Encounter (Signed)
Left message for patient to return call to office. 

## 2013-07-11 NOTE — Telephone Encounter (Signed)
Left message on voicemail to call office on home and mobile.  

## 2013-10-08 ENCOUNTER — Telehealth: Payer: Self-pay | Admitting: Internal Medicine

## 2013-10-08 ENCOUNTER — Encounter: Payer: Self-pay | Admitting: Internal Medicine

## 2013-10-08 ENCOUNTER — Ambulatory Visit (INDEPENDENT_AMBULATORY_CARE_PROVIDER_SITE_OTHER): Payer: BC Managed Care – PPO | Admitting: Internal Medicine

## 2013-10-08 VITALS — BP 112/78 | HR 66 | Temp 98.1°F | Resp 16 | Wt 153.5 lb

## 2013-10-08 DIAGNOSIS — H109 Unspecified conjunctivitis: Secondary | ICD-10-CM

## 2013-10-08 MED ORDER — BACITRACIN-POLYMYXIN B OP OINT: 1.0000 "application " | TOPICAL_OINTMENT | Freq: Two times a day (BID) | OPHTHALMIC | Status: DC

## 2013-10-08 NOTE — Telephone Encounter (Signed)
We have a 4:15 and a 11.30 held for [facility] project can I use one of these since no one is scheduled. Please advise

## 2013-10-08 NOTE — Telephone Encounter (Signed)
Appointment scheduled patient notified,

## 2013-10-08 NOTE — Telephone Encounter (Signed)
11:30 is fine

## 2013-10-08 NOTE — Telephone Encounter (Signed)
Pt states she thinks she has pink eye and needs to be seen by Dr. Derrel Nip.  No appt available.  Refused [place] location.  States she just needs a brief appt.  Transferred to triage.  Pt called back and left vm asking for appt with Dr. Derrel Nip.  Please advise.

## 2013-10-08 NOTE — Progress Notes (Signed)
Pre-visit discussion using our clinic review tool. No additional management support is needed unless otherwise documented below in the visit note.  

## 2013-10-08 NOTE — Progress Notes (Signed)
Patient ID: Terri Andrade, female   DOB: 1950-11-01, 63 y.o.   MRN: [id number]   Patient Active Problem List   Diagnosis Date Noted  . Conjunctivitis of right eye 10/10/2013  . Abdominal mass 06/17/2013  . Status post dilation of esophageal narrowing 03/06/2013  . Other malaise and fatigue 03/04/2013  . Routine general medical examination at a health care facility 03/04/2013  . Other screening mammogram 02/17/2013  . Abnormal breast exam 12/25/2012  . Lump or mass in breast 12/25/2012  . Breast thickening 12/25/2012  . Screening for colon cancer 08/31/2011  . Screening for breast cancer 08/31/2011  . Overweight (BMI 25.0-29.9) 08/31/2011  . Treadmill stress test negative for angina pectoris     Subjective:  CC:   Chief Complaint  Patient presents with  . Acute Visit    Eye pain and drainage,  on right side.    HPI:   Terri Andrade is a 63 y.o. female who presents for Right eye itching and hurting for the past week accompanied by blurring of vision and conjunctival drainage .  eyelashes matted together this morning   Left eye is fine    Past Medical History  Diagnosis Date  . Treadmill stress test negative for angina pectoris 2009  . Lump or mass in breast 2014    Past Surgical History  Procedure Laterality Date  . Spine surgery  2007    Lumbar disk,  [name]  . Cholecystectomy  2004  . Tubal ligation    . Breast biopsy Right 12-25-2012     BENIGN BREAST TISSUE WITH FOCAL COLUMNAR CELL CHANGE, USUAL       The following portions of the patient's history were reviewed and updated as appropriate: Allergies, current medications, and problem list.    Review of Systems:   Patient denies headache, fevers, malaise, unintentional weight loss, skin rash, eye pain, sinus congestion and sinus pain, sore throat, dysphagia,  hemoptysis , cough, dyspnea, wheezing, chest pain, palpitations, orthopnea, edema, abdominal pain, nausea, melena, diarrhea, constipation, flank pain,  dysuria, hematuria, urinary  Frequency, nocturia, numbness, tingling, seizures,  Focal weakness, Loss of consciousness,  Tremor, insomnia, depression, anxiety, and suicidal ideation.     History   Social History  . Marital Status: Married    Spouse Name: N/A    Number of Children: N/A  . Years of Education: N/A   Occupational History  . Not on file.   Social History Main Topics  . Smoking status: Never Smoker   . Smokeless tobacco: Never Used  . Alcohol Use: Yes     Comment: social  . Drug Use: No  . Sexual Activity: Not on file   Other Topics Concern  . Not on file   Social History Narrative  . No narrative on file    Objective:  Filed Vitals:   10/08/13 1134  BP: 112/78  Pulse: 66  Temp: 98.1 F (36.7 C)  Resp: 16     General appearance: alert, cooperative and appears stated age Ears: normal TM's and external ear canals both ears Eyes:  Right eye with scleral/conjunctival erythema and drainage,  No periorbital redness,  Left eye normal.  Throat: lips, mucosa, and tongue normal; teeth and gums normal Neck: no adenopathy, no carotid bruit, supple, symmetrical, trachea midline and thyroid not enlarged, symmetric, no tenderness/mass/nodules Back: symmetric, no curvature. ROM normal. No CVA tenderness. Lungs: clear to auscultation bilaterally Heart: regular rate and rhythm, S1, S2 normal, no murmur, click, rub or gallop Abdomen: soft,  non-tender; bowel sounds normal; no masses,  no organomegaly Pulses: 2+ and symmetric Skin: Skin color, texture, turgor normal. No rashes or lesions Lymph nodes: Cervical, supraclavicular, and axillary nodes normal.  Assessment and Plan:  Conjunctivitis of right eye Likely allergic initially, now with concern for infectious.  Antibiotic drops prescribed .     Updated Medication List Outpatient Encounter Prescriptions as of 10/08/2013  Medication Sig  . bacitracin-polymyxin b, ophth, (POLYSPORIN) OINT Place 1 application into  the right eye every 12 (twelve) hours.     No orders of the defined types were placed in this encounter.    No Follow-up on file.

## 2013-10-10 DIAGNOSIS — H109 Unspecified conjunctivitis: Secondary | ICD-10-CM | POA: Insufficient documentation

## 2013-10-10 NOTE — Assessment & Plan Note (Signed)
Likely allergic initially, now with concern for infectious.  Antibiotic drops prescribed .

## 2013-10-12 ENCOUNTER — Encounter: Payer: Self-pay | Admitting: Internal Medicine

## 2014-01-20 ENCOUNTER — Encounter: Payer: Self-pay | Admitting: General Surgery

## 2014-01-27 ENCOUNTER — Encounter: Payer: Self-pay | Admitting: General Surgery

## 2014-01-27 ENCOUNTER — Ambulatory Visit (INDEPENDENT_AMBULATORY_CARE_PROVIDER_SITE_OTHER): Payer: BC Managed Care – PPO | Admitting: General Surgery

## 2014-01-27 VITALS — BP 118/74 | HR 76 | Resp 14 | Ht 63.0 in | Wt 159.0 lb

## 2014-01-27 DIAGNOSIS — R928 Other abnormal and inconclusive findings on diagnostic imaging of breast: Secondary | ICD-10-CM

## 2014-01-27 DIAGNOSIS — Z1231 Encounter for screening mammogram for malignant neoplasm of breast: Secondary | ICD-10-CM

## 2014-01-27 NOTE — Patient Instructions (Addendum)
Patient to return as needed. Continue self breast exams. Call office for any new breast issues or concerns.'  

## 2014-01-27 NOTE — Progress Notes (Signed)
Patient ID: Terri Andrade, female   DOB: August 05, 1950, 63 y.o.   MRN: [id number]  Chief Complaint  Patient presents with  . Follow-up    mammogram    HPI Terri Andrade is a 63 y.o. female who presents for a breast evaluation. The most recent mammogram was done on 01/19/14.   Patient does perform regular self breast checks and gets regular mammograms done.    HPI  Past Medical History  Diagnosis Date  . Treadmill stress test negative for angina pectoris 2009  . Lump or mass in breast 2014    Past Surgical History  Procedure Laterality Date  . Spine surgery  2007    Lumbar disk,  Terri Andrade  . Cholecystectomy  2004  . Tubal ligation    . Breast biopsy Right 12-25-2012     BENIGN BREAST TISSUE WITH FOCAL COLUMNAR CELL CHANGE, USUAL    Family History  Problem Relation Age of Onset  . Birth defects Neg Hx   . Mental illness Mother     bipolar disorder  . Cancer Father 47    tobacco related [place]  . Cancer Brother     tobacco related [place]    Social History History  Substance Use Topics  . Smoking status: Never Smoker   . Smokeless tobacco: Never Used  . Alcohol Use: Yes     Comment: social    No Known Allergies  No current outpatient prescriptions on file.   No current facility-administered medications for this visit.    Review of Systems Review of Systems  Constitutional: Negative.   Respiratory: Negative.   Cardiovascular: Negative.     Blood pressure 118/74, pulse 76, resp. rate 14, height 5\' 3"  (1.6 m), weight 159 lb (72.122 kg).  Physical Exam Physical Exam  Constitutional: She is oriented to person, place, and time. She appears well-developed and well-nourished.  Eyes: Conjunctivae are normal. No scleral icterus.  Neck: Neck supple.  Cardiovascular: Normal rate, regular rhythm and normal heart sounds.   Pulmonary/Chest: Effort normal and breath sounds normal. Right breast exhibits no inverted nipple, no mass, no nipple discharge, no skin change and no  tenderness. Left breast exhibits no inverted nipple, no mass, no nipple discharge, no skin change and no tenderness.  Abdominal: Soft. Normal appearance and bowel sounds are normal. There is no tenderness.  Focal thickening right side of belly.   Lymphadenopathy:    She has no cervical adenopathy.    She has no axillary adenopathy.  Neurological: She is alert and oriented to person, place, and time.  Skin: Skin is warm and dry.    Data Reviewed Mammograms completed January 19, 2014 at [facility] were independently reviewed. Postbiopsy changes noted. No interval change otherwise appreciated. Annual screening exam is recommended. BI-RAD-2.  Assessment    Benign breast exam.     Plan    The patient should resume annual screening mammograms with her primary care provider. These can be completed at [facility] if acceptable the patient.      PCP: Mattie Marlin 01/28/2014, 9:11 PM

## 2014-03-23 ENCOUNTER — Encounter: Payer: Self-pay | Admitting: Internal Medicine

## 2014-04-09 ENCOUNTER — Telehealth: Payer: Self-pay | Admitting: Internal Medicine

## 2014-04-09 ENCOUNTER — Encounter: Payer: Self-pay | Admitting: Internal Medicine

## 2014-04-09 NOTE — Telephone Encounter (Signed)
The patient was diagnosed with adult asthma about 2-3 yrs. Ago. She is asking for an inhaler. I offered her an appointment at [facility] . She refuses to go , she wants to see Dr. Derrel Nip.

## 2014-04-09 NOTE — Telephone Encounter (Signed)
Ok thanks 

## 2014-04-09 NOTE — Telephone Encounter (Signed)
Called patient's home, cell, and work number - no answer.  Lvmom.

## 2014-04-09 NOTE — Telephone Encounter (Signed)
If she can come in this afternoon at 2:30 I will see her,  But I will not prescribe an inahler witout beeing seen

## 2014-04-09 NOTE — Telephone Encounter (Signed)
The patient called back at 3:05p and asked if she could be seen tomorrow.  I went ahead and put in the 2:30pm slot pending approval from [place].  Is this okay?

## 2014-04-09 NOTE — Telephone Encounter (Signed)
Oops, disregard dr. Derrel Nip, routed in error

## 2014-04-10 ENCOUNTER — Encounter: Payer: Self-pay | Admitting: Internal Medicine

## 2014-04-10 ENCOUNTER — Ambulatory Visit (INDEPENDENT_AMBULATORY_CARE_PROVIDER_SITE_OTHER): Payer: BC Managed Care – PPO | Admitting: Internal Medicine

## 2014-04-10 ENCOUNTER — Ambulatory Visit: Payer: BC Managed Care – PPO | Admitting: Internal Medicine

## 2014-04-10 VITALS — BP 124/78 | HR 85 | Temp 98.0°F | Resp 16 | Ht 63.0 in | Wt 161.0 lb

## 2014-04-10 DIAGNOSIS — Z8709 Personal history of other diseases of the respiratory system: Secondary | ICD-10-CM

## 2014-04-10 DIAGNOSIS — J4520 Mild intermittent asthma, uncomplicated: Secondary | ICD-10-CM

## 2014-04-10 DIAGNOSIS — Z23 Encounter for immunization: Secondary | ICD-10-CM

## 2014-04-10 DIAGNOSIS — R5381 Other malaise: Secondary | ICD-10-CM

## 2014-04-10 DIAGNOSIS — E785 Hyperlipidemia, unspecified: Secondary | ICD-10-CM

## 2014-04-10 DIAGNOSIS — R5383 Other fatigue: Secondary | ICD-10-CM

## 2014-04-10 LAB — CBC WITH DIFFERENTIAL/PLATELET
Basophils Absolute: 0.1 10*3/uL (ref 0.0–0.1)
Basophils Relative: 1 % (ref 0–1)
EOS PCT: 4 % (ref 0–5)
Eosinophils Absolute: 0.3 10*3/uL (ref 0.0–0.7)
HEMATOCRIT: 40.1 % (ref 36.0–46.0)
Hemoglobin: 14.4 g/dL (ref 12.0–15.0)
LYMPHS ABS: 2.5 10*3/uL (ref 0.7–4.0)
LYMPHS PCT: 38 % (ref 12–46)
MCH: 32.7 pg (ref 26.0–34.0)
MCHC: 35.9 g/dL (ref 30.0–36.0)
MCV: 90.9 fL (ref 78.0–100.0)
MONO ABS: 0.7 10*3/uL (ref 0.1–1.0)
Monocytes Relative: 10 % (ref 3–12)
Neutro Abs: 3.1 10*3/uL (ref 1.7–7.7)
Neutrophils Relative %: 47 % (ref 43–77)
Platelets: 394 10*3/uL (ref 150–400)
RBC: 4.41 MIL/uL (ref 3.87–5.11)
RDW: 12.3 % (ref 11.5–15.5)
WBC: 6.5 10*3/uL (ref 4.0–10.5)

## 2014-04-10 MED ORDER — ALBUTEROL SULFATE HFA 108 (90 BASE) MCG/ACT IN AERS: 2.0000 | INHALATION_SPRAY | Freq: Four times a day (QID) | RESPIRATORY_TRACT | Status: DC | PRN

## 2014-04-10 MED ORDER — MONTELUKAST SODIUM 10 MG PO TABS: 10.0000 mg | ORAL_TABLET | Freq: Every day | ORAL | Status: DC

## 2014-04-10 NOTE — Patient Instructions (Addendum)
Take the monteleukast daily to prevent ashtma and the albuterol as needed   Asthma Attack Prevention Although there is no way to prevent asthma from starting, you can take steps to control the disease and reduce its symptoms. Learn about your asthma and how to control it. Take an active role to control your asthma by working with your health care provider to create and follow an asthma action plan. An asthma action plan guides you in:  Taking your medicines properly.  Avoiding things that set off your asthma or make your asthma worse (asthma triggers).  Tracking your level of asthma control.  Responding to worsening asthma.  Seeking emergency care when needed. To track your asthma, keep records of your symptoms, check your peak flow number using a handheld device that shows how well air moves out of your lungs (peak flow meter), and get regular asthma checkups.  WHAT ARE SOME WAYS TO PREVENT AN ASTHMA ATTACK?  Take medicines as directed by your health care provider.  Keep track of your asthma symptoms and level of control.  With your health care provider, write a detailed plan for taking medicines and managing an asthma attack. Then be sure to follow your action plan. Asthma is an ongoing condition that needs regular monitoring and treatment.  Identify and avoid asthma triggers. Many outdoor allergens and irritants (such as pollen, mold, cold air, and air pollution) can trigger asthma attacks. Find out what your asthma triggers are and take steps to avoid them.  Monitor your breathing. Learn to recognize warning signs of an attack, such as coughing, wheezing, or shortness of breath. Your lung function may decrease before you notice any signs or symptoms, so regularly measure and record your peak airflow with a home peak flow meter.  Identify and treat attacks early. If you act quickly, you are less likely to have a severe attack. You will also need less medicine to control your symptoms.  When your peak flow measurements decrease and alert you to an upcoming attack, take your medicine as instructed and immediately stop any activity that may have triggered the attack. If your symptoms do not improve, get medical help.  Pay attention to increasing quick-relief inhaler use. If you find yourself relying on your quick-relief inhaler, your asthma is not under control. See your health care provider about adjusting your treatment. WHAT CAN MAKE MY SYMPTOMS WORSE? A number of common things can set off or make your asthma symptoms worse and cause temporary increased inflammation of your airways. Keep track of your asthma symptoms for several weeks, detailing all the environmental and emotional factors that are linked with your asthma. When you have an asthma attack, go back to your asthma diary to see which factor, or combination of factors, might have contributed to it. Once you know what these factors are, you can take steps to control many of them. If you have allergies and asthma, it is important to take asthma prevention steps at home. Minimizing contact with the substance to which you are allergic will help prevent an asthma attack. Some triggers and ways to avoid these triggers are: Animal Dander:  Some people are allergic to the flakes of skin or dried saliva from animals with fur or feathers.   There is no such thing as a hypoallergenic dog or cat breed. All dogs or cats can cause allergies, even if they don't shed.  Keep these pets out of your home.  If you are not able to keep a pet  outdoors, keep the pet out of your bedroom and other sleeping areas at all times, and keep the door closed.  Remove carpets and furniture covered with cloth from your home. If that is not possible, keep the pet away from fabric-covered furniture and carpets. Dust Mites: Many people with asthma are allergic to dust mites. Dust mites are tiny bugs that are found in every home in mattresses, pillows,  carpets, fabric-covered furniture, bedcovers, clothes, stuffed toys, and other fabric-covered items.   Cover your mattress in a special dust-proof cover.  Cover your pillow in a special dust-proof cover, or wash the pillow each week in hot water. Water must be hotter than 130 F (54.4 C) to kill dust mites. Cold or warm water used with detergent and bleach can also be effective.  Wash the sheets and blankets on your bed each week in hot water.  Try not to sleep or lie on cloth-covered cushions.  Call ahead when traveling and ask for a smoke-free hotel room. Bring your own bedding and pillows in case the hotel only supplies feather pillows and down comforters, which may contain dust mites and cause asthma symptoms.  Remove carpets from your bedroom and those laid on concrete, if you can.  Keep stuffed toys out of the bed, or wash the toys weekly in hot water or cooler water with detergent and bleach. Cockroaches: Many people with asthma are allergic to the droppings and remains of cockroaches.   Keep food and garbage in closed containers. Never leave food out.  Use poison baits, traps, powders, gels, or paste (for example, boric acid).  If a spray is used to kill cockroaches, stay out of the room until the odor goes away. Indoor Mold:  Fix leaky faucets, pipes, or other sources of water that have mold around them.  Clean floors and moldy surfaces with a fungicide or diluted bleach.  Avoid using humidifiers, vaporizers, or swamp coolers. These can spread molds through the air. Pollen and Outdoor Mold:  When pollen or mold spore counts are high, try to keep your windows closed.  Stay indoors with windows closed from late morning to afternoon. Pollen and some mold spore counts are highest at that time.  Ask your health care provider whether you need to take anti-inflammatory medicine or increase your dose of the medicine before your allergy season starts. Other Irritants to  Avoid:  Tobacco smoke is an irritant. If you smoke, ask your health care provider how you can quit. Ask family members to quit smoking, too. Do not allow smoking in your home or car.  If possible, do not use a wood-burning stove, kerosene heater, or fireplace. Minimize exposure to all sources of smoke, including incense, candles, fires, and fireworks.  Try to stay away from strong odors and sprays, such as perfume, talcum powder, hair spray, and paints.  Decrease humidity in your home and use an indoor air cleaning device. Reduce indoor humidity to below 60%. Dehumidifiers or central air conditioners can do this.  Decrease house dust exposure by changing furnace and air cooler filters frequently.  Try to have someone else vacuum for you once or twice a week. Stay out of rooms while they are being vacuumed and for a short while afterward.  If you vacuum, use a dust mask from a hardware store, a double-layered or microfilter vacuum cleaner bag, or a vacuum cleaner with a HEPA filter.  Sulfites in foods and beverages can be irritants. Do not drink beer or wine or  eat dried fruit, processed potatoes, or shrimp if they cause asthma symptoms.  Cold air can trigger an asthma attack. Cover your nose and mouth with a scarf on cold or windy days.  Several health conditions can make asthma more difficult to manage, including a runny nose, sinus infections, reflux disease, psychological stress, and sleep apnea. Work with your health care provider to manage these conditions.  Avoid close contact with people who have a respiratory infection such as a cold or the flu, since your asthma symptoms may get worse if you catch the infection. Wash your hands thoroughly after touching items that may have been handled by people with a respiratory infection.  Get a flu shot every year to protect against the flu virus, which often makes asthma worse for days or weeks. Also get a pneumonia shot if you have not  previously had one. Unlike the flu shot, the pneumonia shot does not need to be given yearly. Medicines:  Talk to your health care provider about whether it is safe for you to take aspirin or non-steroidal anti-inflammatory medicines (NSAIDs). In a small number of people with asthma, aspirin and NSAIDs can cause asthma attacks. These medicines must be avoided by people who have known aspirin-sensitive asthma. It is important that people with aspirin-sensitive asthma read labels of all over-the-counter medicines used to treat pain, colds, coughs, and fever.  Beta-blockers and ACE inhibitors are other medicines you should discuss with your health care provider. HOW CAN I FIND OUT WHAT I AM ALLERGIC TO? Ask your asthma health care provider about allergy skin testing or blood testing (the RAST test) to identify the allergens to which you are sensitive. If you are found to have allergies, the most important thing to do is to try to avoid exposure to any allergens that you are sensitive to as much as possible. Other treatments for allergies, such as medicines and allergy shots (immunotherapy) are available.  CAN I EXERCISE? Follow your health care provider's advice regarding asthma treatment before exercising. It is important to maintain a regular exercise program, but vigorous exercise or exercise in cold, humid, or dry environments can cause asthma attacks, especially for those people who have exercise-induced asthma. Document Released: 05/17/2009 Document Revised: 06/03/2013 Document Reviewed: 12/04/2012 [facility] Patient Information 2015 [place], [place]. This information is not intended to replace advice given to you by your health care provider. Make sure you discuss any questions you have with your health care provider.

## 2014-04-10 NOTE — Progress Notes (Signed)
Pre-visit discussion using our clinic review tool. No additional management support is needed unless otherwise documented below in the visit note.  

## 2014-04-11 LAB — COMPREHENSIVE METABOLIC PANEL WITH GFR
ALT: 28 U/L (ref 0–35)
AST: 24 U/L (ref 0–37)
Albumin: 4.4 g/dL (ref 3.5–5.2)
Alkaline Phosphatase: 69 U/L (ref 39–117)
BUN: 19 mg/dL (ref 6–23)
CO2: 26 meq/L (ref 19–32)
Calcium: 9.5 mg/dL (ref 8.4–10.5)
Chloride: 102 meq/L (ref 96–112)
Creat: 0.84 mg/dL (ref 0.50–1.10)
Glucose, Bld: 85 mg/dL (ref 70–99)
Potassium: 4.1 meq/L (ref 3.5–5.3)
Sodium: 138 meq/L (ref 135–145)
Total Bilirubin: 0.3 mg/dL (ref 0.2–1.2)
Total Protein: 7.4 g/dL (ref 6.0–8.3)

## 2014-04-11 LAB — LIPID PANEL
Cholesterol: 220 mg/dL — ABNORMAL HIGH (ref 0–200)
HDL: 51 mg/dL
LDL Cholesterol: 135 mg/dL — ABNORMAL HIGH (ref 0–99)
Total CHOL/HDL Ratio: 4.3 ratio
Triglycerides: 172 mg/dL — ABNORMAL HIGH
VLDL: 34 mg/dL (ref 0–40)

## 2014-04-12 DIAGNOSIS — J45901 Unspecified asthma with (acute) exacerbation: Secondary | ICD-10-CM | POA: Insufficient documentation

## 2014-04-12 NOTE — Assessment & Plan Note (Signed)
Per patient,  Currently asymptomatic.  Exam is normal. Records requested as there is no report of PFTS on file.  singulair and pro air prescribed

## 2014-04-12 NOTE — Progress Notes (Signed)
Patient ID: Terri Andrade, female   DOB: 1951/05/18, 63 y.o.   MRN: [id number]  Patient Active Problem List   Diagnosis Date Noted  . History of asthma 04/12/2014  . Abdominal mass 06/17/2013  . Status post dilation of esophageal narrowing 03/06/2013  . Other malaise and fatigue 03/04/2013  . Routine general medical examination at a health care facility 03/04/2013  . Other screening mammogram 02/17/2013  . Breast thickening 12/25/2012  . Screening for colon cancer 08/31/2011  . Screening for breast cancer 08/31/2011  . Overweight (BMI 25.0-29.9) 08/31/2011  . Treadmill stress test negative for angina pectoris     Subjective:  CC:   Chief Complaint  Patient presents with  . Acute Visit    feeling better today. yesterday was wheezing and coughing.    HPI:   GRABIELA Andrade is a 63 y.o. female who presents for  Follow up on need for inhaler .  Patient states that she has had symptoms of asthma for several days including wheezing, and nonproductive cough for several weeks. She states that she was diagnosed with asthma several years ago with pulmonary function tests, but has not had any asthma exacerbations in years and does not use inhaled steroids or bronchodilators regularly.  She denies tobacco use, recent travel,  Chest pain, leg pain and shortness  of breath.  Symptoms have not been present in the last 24 hours.    Past Medical History  Diagnosis Date  . Treadmill stress test negative for angina pectoris 2009  . Lump or mass in breast 2014    Past Surgical History  Procedure Laterality Date  . Spine surgery  2007    Lumbar disk,  [name]  . Cholecystectomy  2004  . Tubal ligation    . Breast biopsy Right 12-25-2012     BENIGN BREAST TISSUE WITH FOCAL COLUMNAR CELL CHANGE, USUAL       The following portions of the patient's history were reviewed and updated as appropriate: Allergies, current medications, and problem list.    Review of Systems:   Patient denies  headache, fevers, malaise, unintentional weight loss, skin rash, eye pain, sinus congestion and sinus pain, sore throat, dysphagia,  hemoptysis , cough, dyspnea, wheezing, chest pain, palpitations, orthopnea, edema, abdominal pain, nausea, melena, diarrhea, constipation, flank pain, dysuria, hematuria, urinary  Frequency, nocturia, numbness, tingling, seizures,  Focal weakness, Loss of consciousness,  Tremor, insomnia, depression, anxiety, and suicidal ideation.     History   Social History  . Marital Status: Married    Spouse Name: N/A    Number of Children: N/A  . Years of Education: N/A   Occupational History  . Not on file.   Social History Main Topics  . Smoking status: Never Smoker   . Smokeless tobacco: Never Used  . Alcohol Use: Yes     Comment: social  . Drug Use: No  . Sexual Activity: Not on file   Other Topics Concern  . Not on file   Social History Narrative    Objective:  Filed Vitals:   04/10/14 1432  BP: 124/78  Pulse: 85  Temp: 98 F (36.7 C)  Resp: 16     General appearance: alert, cooperative and appears stated age Ears: normal TM's and external ear canals both ears Throat: lips, mucosa, and tongue normal; teeth and gums normal Neck: no adenopathy, no carotid bruit, supple, symmetrical, trachea midline and thyroid not enlarged, symmetric, no tenderness/mass/nodules Back: symmetric, no curvature. ROM normal. No CVA  tenderness. Lungs: clear to auscultation bilaterally Heart: regular rate and rhythm, S1, S2 normal, no murmur, click, rub or gallop Abdomen: soft, non-tender; bowel sounds normal; no masses,  no organomegaly Pulses: 2+ and symmetric Skin: Skin color, texture, turgor normal. No rashes or lesions Lymph nodes: Cervical, supraclavicular, and axillary nodes normal.  Assessment and Plan:  History of asthma Per patient,  Currently asymptomatic.  Exam is normal. Records requested as there is no report of PFTS on file.  singulair and pro air  prescribed   Updated Medication List Outpatient Encounter Prescriptions as of 04/10/2014  Medication Sig  . albuterol (PROVENTIL HFA;VENTOLIN HFA) 108 (90 BASE) MCG/ACT inhaler Inhale 2 puffs into the lungs every 6 (six) hours as needed for wheezing or shortness of breath.  . montelukast (SINGULAIR) 10 MG tablet Take 1 tablet (10 mg total) by mouth at bedtime.     Orders Placed This Encounter  Procedures  . Pneumococcal polysaccharide vaccine 23-valent greater than or equal to 2yo subcutaneous/IM  . CBC with Differential  . Comprehensive metabolic panel  . Lipid panel    Return in about 8 months (around 12/10/2014).

## 2014-04-15 ENCOUNTER — Encounter: Payer: Self-pay | Admitting: Internal Medicine

## 2014-07-07 ENCOUNTER — Telehealth: Payer: Self-pay | Admitting: Internal Medicine

## 2014-07-07 MED ORDER — PREDNISONE 10 MG PO TABS: ORAL_TABLET | ORAL | Status: DC

## 2014-07-07 NOTE — Telephone Encounter (Signed)
She also needs to start the prednisone I sent to [facility]  6 tablets daily starting today,  See chart

## 2014-07-07 NOTE — Telephone Encounter (Signed)
Left message for patient to return call to office. 

## 2014-07-07 NOTE — Telephone Encounter (Signed)
Patient notified and voiced understanding.

## 2014-07-07 NOTE — Telephone Encounter (Signed)
Asthma type symptoms with symptoms of wheezing and cough on exertion , would like to know if should start using the albuterol inhaler prescribed by MD advised patient would go ahead and use as prescribed. Scheduled patient for Thursday at 9.45 acute.

## 2014-07-09 ENCOUNTER — Encounter: Payer: Self-pay | Admitting: Internal Medicine

## 2014-07-09 ENCOUNTER — Ambulatory Visit (INDEPENDENT_AMBULATORY_CARE_PROVIDER_SITE_OTHER): Payer: BLUE CROSS/BLUE SHIELD | Admitting: Internal Medicine

## 2014-07-09 VITALS — BP 108/68 | HR 67 | Temp 97.3°F | Resp 14 | Ht 63.0 in | Wt 158.5 lb

## 2014-07-09 DIAGNOSIS — E663 Overweight: Secondary | ICD-10-CM

## 2014-07-09 DIAGNOSIS — J45901 Unspecified asthma with (acute) exacerbation: Secondary | ICD-10-CM

## 2014-07-09 MED ORDER — OMEPRAZOLE 40 MG PO CPDR: 40.0000 mg | DELAYED_RELEASE_CAPSULE | Freq: Every day | ORAL | Status: DC

## 2014-07-09 NOTE — Patient Instructions (Signed)
Prednisone dose change:  60 mg today,  40 mg tomorrow 20 mg Saturday then stop , (save the rest for next time)    Your last fasting glucose indicates you are at risk for developing diabetes, so I am checking an A1c today    This is  my version of a  "Low GI"  Diet:  It will still lower your blood sugars and allow you to lose 4 to 8  lbs  per month if you follow it carefully.  Your goal with exercise is a minimum of 30 minutes of aerobic exercise 5 days per week (Walking does not count once it becomes easy!)     All of the foods can be found at grocery stores and in bulk at [employer].  The Atkins protein bars and shakes are available in more varieties at [employer], [employer] and [place].     7 AM Breakfast:  Choose from the following:  Low carbohydrate Protein  Shakes (I recommend the EAS AdvantEdge "Carb Control" shakes,  Atkins,  Premier Protein , or Muscle Milk   Arnold's "Sandwhich Thin"toasted  w/ peanut butter (no jelly: about 20 net carbs)  "Bagel Thin" with cream cheese and salmon: about 20 carbs   a scrambled egg/bacon/cheese burrito made with Mission's "carb balance" whole wheat tortilla  (about 10 net carbs )  A slice of home made fritatta (egg based dish without a crust:  google it)    Avoid cereal and bananas, oatmeal and cream of wheat and grits. They are loaded with carbohydrates!   10 AM: high protein snack  Protein bar by Atkins (the snack size, under 200 cal, usually < 6 net carbs)    And KIND bars   A stick of cheese:  Around 1 carb,  100 cal     Dannon Light n Fit [place] Yogurt  (80 cal, 8 carbs)  Other so called "protein bars" and Greek yogurts tend to be loaded with carbohydrates.  Remember, in food advertising, the word "energy" is synonymous for " carbohydrate."  Lunch:   A Sandwich using the bread choices listed, Can use any  Eggs,  lunchmeat, grilled meat or canned tuna), avocado, regular mayo/mustard  and cheese.  A Salad using blue cheese, ranch,  Goddess or  vinagrette,  No croutons or "confetti" and no "candied nuts" but regular nuts OK.   No pretzels or chips.  Pickles and miniature sweet peppers are a good low carb alternative that provide a "crunch"  The bread is the only source of carbohydrate in a sandwich and  can be decreased by trying some of these alternatives to traditional loaf bread  Joseph's makes a pita bread and a flat bread that are 50 cal and 4 net carbs available at [place] and [place].  This can be toasted to use with hummous as well  Toufayan makes a low carb flatbread that's 100 cal and 9 net carbs available at [employer] and [employer]'s makes 2 sizes of  Low carb whole wheat tortilla  (The large one is 210 cal and 6 net carbs)  Flat Out makes flatbreads that are low carb as well  Avoid "Low fat dressings, as well as Barry Brunner and [place] dressings They are loaded with sugar!   3 PM/ Mid day  Snack:  Consider  1 ounce of  almonds, walnuts, pistachios, pecans, peanuts,  Macadamia nuts or a nut medley.  Avoid "granola"; the dried cranberries and raisins are loaded with carbohydrates.  Mixed nuts as long as there are no raisins,  cranberries or dried fruit.    Try the prosciutto/mozzarella cheese sticks by [employer]  In deli /backery section   High protein   To avoid overindulging in snacks: Try drinking a glass of unsweeted almond/coconut milk  Or a cup of coffee with your Atkins chocolate bar to keep you from having 3!!!   Pork rinds!  Yes Pork Rinds        6 PM  Dinner:     Meat/fowl/fish with a green salad, and either broccoli, cauliflower, green beans, spinach, brussel sprouts or  Lima beans. DO NOT BREAD THE PROTEIN!!      There is a low carb pasta by Dreamfield's that is acceptable and tastes great: only 5 digestible carbs/serving.( All grocery stores but BJs carry it )  Try Hurley Cisco Angelo's chicken piccata or chicken or eggplant parm over low carb pasta.(Lowes and BJs)   Marjory Lies Sanchez's "Carnitas" (pulled pork, no  sauce,  0 carbs) or his beef pot roast to make a dinner burrito (at [employer]J's)  Pesto over low carb pasta (bj's sells a good quality pesto in the center refrigerated section of the deli   Try satueeing  Cheral Marker with mushroooms  Whole wheat pasta is still full of digestible carbs and  Not as low in glycemic index as Dreamfield's.   Brown rice is still rice,  So skip the rice and noodles if you eat [place] or [place] (or at least limit to 1/2 cup)  9 PM snack :   Breyer's "low carb" fudgsicle or  ice cream bar (Carb Smart line), or  Weight Watcher's ice cream bar , or another "no sugar added" ice cream;  a serving of fresh berries/cherries with whipped cream   Cheese or DANNON'S LlGHT N FIT GREEK YOGURT or the [place] greek yogurt   8 ounces of [place] unsweetened almond/cococunut milk  Cheese and crackers (using WASA crackers,  They are low carb) or peanut butter on low carb crackers or pita bread     Avoid bananas, pineapple, grapes  and watermelon on a regular basis because they are high in sugar.  THINK OF THEM AS DESSERT  Remember that snack Substitutions should be less than 10 NET carbs per serving and meals should be < 25 net carbs. Remember that carbohydrates from fiber do not affect blood sugar, so you can  subtract fiber grams to get the "net carbs " of any particular food item.

## 2014-07-09 NOTE — Progress Notes (Signed)
Pre visit review using our clinic review tool, if applicable. No additional management support is needed unless otherwise documented below in the visit note. 

## 2014-07-09 NOTE — Progress Notes (Signed)
Patient ID: Terri Andrade, female   DOB: 12-10-1950, 64 y.o.   MRN: [id number]   Patient Active Problem List   Diagnosis Date Noted  . Asthma exacerbation 04/12/2014  . Abdominal mass 06/17/2013  . Status post dilation of esophageal narrowing 03/06/2013  . Other malaise and fatigue 03/04/2013  . Routine general medical examination at a health care facility 03/04/2013  . Other screening mammogram 02/17/2013  . Breast thickening 12/25/2012  . Screening for colon cancer 08/31/2011  . Screening for breast cancer 08/31/2011  . Overweight (BMI 25.0-29.9) 08/31/2011  . Treadmill stress test negative for angina pectoris     Subjective:  CC:   Chief Complaint  Patient presents with  . Cough    started with wheezing, SOB on Tuesday, started on prednison, much improved    HPI:   [name] is a 64 y.o. female who presents for asthma exacerbation.  Patient was empirically given  Prednisone taper which was started on Tuesday by phone for a viral infection with headache ,  Chills,  Body aches,  Malaise, and low grade fevers.  Her symptoms have improved and her wheezing is improved as well.  Has a cough but No purulent sputum   Past Medical History  Diagnosis Date  . Treadmill stress test negative for angina pectoris 2009  . Lump or mass in breast 2014    Past Surgical History  Procedure Laterality Date  . Spine surgery  2007    Lumbar disk,  [name]  . Cholecystectomy  2004  . Tubal ligation    . Breast biopsy Right 12-25-2012     BENIGN BREAST TISSUE WITH FOCAL COLUMNAR CELL CHANGE, USUAL       The following portions of the patient's history were reviewed and updated as appropriate: Allergies, current medications, and problem list.    Review of Systems:   Patient denies headache, fevers, malaise, unintentional weight loss, skin rash, eye pain, sinus congestion and sinus pain, sore throat, dysphagia,  hemoptysis , cough, dyspnea, wheezing, chest pain, palpitations,  orthopnea, edema, abdominal pain, nausea, melena, diarrhea, constipation, flank pain, dysuria, hematuria, urinary  Frequency, nocturia, numbness, tingling, seizures,  Focal weakness, Loss of consciousness,  Tremor, insomnia, depression, anxiety, and suicidal ideation.     History   Social History  . Marital Status: Married    Spouse Name: N/A    Number of Children: N/A  . Years of Education: N/A   Occupational History  . Not on file.   Social History Main Topics  . Smoking status: Never Smoker   . Smokeless tobacco: Never Used  . Alcohol Use: Yes     Comment: social  . Drug Use: No  . Sexual Activity: Not on file   Other Topics Concern  . Not on file   Social History Narrative    Objective:  Filed Vitals:   07/09/14 0940  BP: 108/68  Pulse: 67  Temp: 97.3 F (36.3 C)  Resp: 14     General appearance: alert, cooperative and appears stated age Ears: normal TM's and external ear canals both ears Throat: lips, mucosa, and tongue normal; teeth and gums normal Neck: no adenopathy, no carotid bruit, supple, symmetrical, trachea midline and thyroid not enlarged, symmetric, no tenderness/mass/nodules Back: symmetric, no curvature. ROM normal. No CVA tenderness. Lungs: clear to auscultation bilaterally Heart: regular rate and rhythm, S1, S2 normal, no murmur, click, rub or gallop Abdomen: soft, non-tender; bowel sounds normal; no masses,  no organomegaly Pulses: 2+ and  symmetric Skin: Skin color, texture, turgor normal. No rashes or lesions Lymph nodes: Cervical, supraclavicular, and axillary nodes normal.  Assessment and Plan:  Asthma exacerbation Given her rapid improvement, will reduce the prednisone dose to 5 days to aooid need for tapering.  This is her second asthma exacerbation in in 3 months.    Overweight (BMI 25.0-29.9) I have addressed  BMI and recommended a low glycemic index diet utilizing smaller more frequent meals to increase metabolism.  I have also  recommended that patient start exercising with a goal of 30 minutes of aerobic exercise a minimum of 5 days per week.       Updated Medication List Outpatient Encounter Prescriptions as of 07/09/2014  Medication Sig  . albuterol (PROVENTIL HFA;VENTOLIN HFA) 108 (90 BASE) MCG/ACT inhaler Inhale 2 puffs into the lungs every 6 (six) hours as needed for wheezing or shortness of breath.  . montelukast (SINGULAIR) 10 MG tablet Take 1 tablet (10 mg total) by mouth at bedtime.  . predniSONE (DELTASONE) 10 MG tablet 6 tablets daily x 3 days,  Then reduce by 1 tablet daily until gone  . omeprazole (PRILOSEC) 40 MG capsule Take 1 capsule (40 mg total) by mouth daily.     No orders of the defined types were placed in this encounter.    No Follow-up on file.

## 2014-07-12 ENCOUNTER — Encounter: Payer: Self-pay | Admitting: Internal Medicine

## 2014-07-12 NOTE — Assessment & Plan Note (Signed)
Given her rapid improvement, will reduce the prednisone dose to 5 days to aooid need for tapering.  This is her second asthma exacerbation in in 3 months.

## 2014-07-12 NOTE — Assessment & Plan Note (Signed)
I have addressed  BMI and recommended a low glycemic index diet utilizing smaller more frequent meals to increase metabolism.  I have also recommended that patient start exercising with a goal of 30 minutes of aerobic exercise a minimum of 5 days per week.  

## 2014-08-13 ENCOUNTER — Other Ambulatory Visit: Payer: Self-pay | Admitting: Internal Medicine

## 2014-08-22 ENCOUNTER — Encounter: Payer: Self-pay | Admitting: Family Medicine

## 2014-08-22 ENCOUNTER — Ambulatory Visit (INDEPENDENT_AMBULATORY_CARE_PROVIDER_SITE_OTHER): Payer: BLUE CROSS/BLUE SHIELD | Admitting: Family Medicine

## 2014-08-22 VITALS — BP 122/82 | HR 73 | Temp 98.3°F | Wt 156.0 lb

## 2014-08-22 DIAGNOSIS — M79605 Pain in left leg: Secondary | ICD-10-CM

## 2014-08-22 NOTE — Assessment & Plan Note (Signed)
Likely a benign muscle strain, no reason to suspect fx or ominous sx.  Ice, ibuprofen with GI caution, and use an OTC knee sleeve.  Should resolve slowly, f/u with PCP prn.

## 2014-08-22 NOTE — Progress Notes (Signed)
Pre visit review using our clinic review tool, if applicable. No additional management support is needed unless otherwise documented below in the visit note.  [place] about 2 weeks ago. [place] the beach and [place] stairs.  No LOC with the fall.  She caught herself on the way down.   Still has pain on the L proximal lateral shin, just below the knee. The knee joint hasn't been bruised red or puffy.   Pain with sitting, more than walking. She can still bear weight.   She doesn't recall hitting that specific area.   She was told in the past the she had a Baker's cyst.   She took ibuprofen and ASA in the meantime.    Meds, vitals, and allergies reviewed.   ROS: See HPI.  Otherwise, noncontributory.  nad ncat rrr ctab L knee and shin with normal inspection No bruising.  Normal L knee ROM, ACL MCL LCL feel intact Joint line not ttp but slightly tender in popliteal area and inferior to L knee on the proximal lateral shin.  Distally NV intact w/o tenderness on bony prominences distally.

## 2014-08-22 NOTE — Patient Instructions (Signed)
Likely a muscle strain.  Ice, ibuprofen with food, and use an OTC knee sleeve.  Should resolve slowly, follow up with PCP as needed.  Take care.  Glad to see you.

## 2014-08-23 ENCOUNTER — Encounter: Payer: Self-pay | Admitting: Internal Medicine

## 2014-08-24 ENCOUNTER — Ambulatory Visit: Payer: Self-pay | Admitting: Internal Medicine

## 2015-04-20 ENCOUNTER — Encounter: Payer: Self-pay | Admitting: Internal Medicine

## 2015-05-05 ENCOUNTER — Ambulatory Visit: Payer: BLUE CROSS/BLUE SHIELD | Admitting: Internal Medicine

## 2015-05-13 ENCOUNTER — Encounter: Payer: Self-pay | Admitting: Internal Medicine

## 2015-05-13 ENCOUNTER — Ambulatory Visit
Admission: RE | Admit: 2015-05-13 | Discharge: 2015-05-13 | Disposition: A | Discharge status: Home / Self Care | Payer: BLUE CROSS/BLUE SHIELD | Source: Ambulatory Visit | Attending: Internal Medicine | Admitting: Internal Medicine

## 2015-05-13 ENCOUNTER — Ambulatory Visit (INDEPENDENT_AMBULATORY_CARE_PROVIDER_SITE_OTHER): Payer: BLUE CROSS/BLUE SHIELD | Admitting: Internal Medicine

## 2015-05-13 VITALS — BP 112/78 | HR 80 | Temp 97.8°F | Resp 12 | Ht 63.0 in | Wt 168.2 lb

## 2015-05-13 DIAGNOSIS — R635 Abnormal weight gain: Secondary | ICD-10-CM | POA: Insufficient documentation

## 2015-05-13 DIAGNOSIS — Z23 Encounter for immunization: Secondary | ICD-10-CM

## 2015-05-13 DIAGNOSIS — G4700 Insomnia, unspecified: Secondary | ICD-10-CM | POA: Diagnosis not present

## 2015-05-13 DIAGNOSIS — K219 Gastro-esophageal reflux disease without esophagitis: Secondary | ICD-10-CM | POA: Insufficient documentation

## 2015-05-13 DIAGNOSIS — K2100 Gastro-esophageal reflux disease with esophagitis, without bleeding: Secondary | ICD-10-CM

## 2015-05-13 DIAGNOSIS — N951 Menopausal and female climacteric states: Secondary | ICD-10-CM | POA: Insufficient documentation

## 2015-05-13 DIAGNOSIS — E663 Overweight: Secondary | ICD-10-CM

## 2015-05-13 DIAGNOSIS — Z1239 Encounter for other screening for malignant neoplasm of breast: Secondary | ICD-10-CM

## 2015-05-13 DIAGNOSIS — Z1159 Encounter for screening for other viral diseases: Secondary | ICD-10-CM

## 2015-05-13 DIAGNOSIS — R5383 Other fatigue: Secondary | ICD-10-CM | POA: Diagnosis not present

## 2015-05-13 LAB — COMPREHENSIVE METABOLIC PANEL
ALT: 35 U/L (ref 0–35)
AST: 28 U/L (ref 0–37)
Albumin: 4.5 g/dL (ref 3.5–5.2)
Alkaline Phosphatase: 78 U/L (ref 39–117)
BUN: 20 mg/dL (ref 6–23)
CO2: 29 meq/L (ref 19–32)
Calcium: 10.2 mg/dL (ref 8.4–10.5)
Chloride: 97 mEq/L (ref 96–112)
Creatinine, Ser: 0.86 mg/dL (ref 0.40–1.20)
GFR: 70.86 mL/min (ref >60.00)
GLUCOSE: 83 mg/dL (ref 70–99)
POTASSIUM: 4.7 meq/L (ref 3.5–5.1)
Sodium: 136 mEq/L (ref 135–145)
TOTAL PROTEIN: 8.1 g/dL (ref 6.0–8.3)
Total Bilirubin: 0.5 mg/dL (ref 0.2–1.2)

## 2015-05-13 LAB — CBC WITH DIFFERENTIAL/PLATELET
Basophils Absolute: 0 10*3/uL (ref 0.0–0.1)
Basophils Relative: 0.6 % (ref 0.0–3.0)
EOS PCT: 2.3 % (ref 0.0–5.0)
Eosinophils Absolute: 0.2 10*3/uL (ref 0.0–0.7)
HEMATOCRIT: 44.5 % (ref 36.0–46.0)
HEMOGLOBIN: 15.2 g/dL — AB (ref 12.0–15.0)
Lymphocytes Relative: 31.5 % (ref 12.0–46.0)
Lymphs Abs: 2.1 10*3/uL (ref 0.7–4.0)
MCHC: 34.2 g/dL (ref 30.0–36.0)
MCV: 96.1 fl (ref 78.0–100.0)
MONOS PCT: 6.6 % (ref 3.0–12.0)
Monocytes Absolute: 0.4 10*3/uL (ref 0.1–1.0)
Neutro Abs: 3.9 10*3/uL (ref 1.4–7.7)
Neutrophils Relative %: 59 % (ref 43.0–77.0)
Platelets: 400 10*3/uL (ref 150.0–400.0)
RBC: 4.63 Mil/uL (ref 3.87–5.11)
RDW: 11.7 % (ref 11.5–15.5)
WBC: 6.5 10*3/uL (ref 4.0–10.5)

## 2015-05-13 LAB — IBC PANEL
Iron: 100 ug/dL (ref 42–145)
Saturation Ratios: 23.7 % (ref 20.0–50.0)
Transferrin: 301 mg/dL (ref 212.0–360.0)

## 2015-05-13 LAB — H. PYLORI ANTIBODY, IGG: H Pylori IgG: NEGATIVE

## 2015-05-13 LAB — TSH: TSH: 0.84 u[IU]/mL (ref 0.35–4.50)

## 2015-05-13 LAB — LDL CHOLESTEROL, DIRECT: Direct LDL: 165 mg/dL

## 2015-05-13 NOTE — Patient Instructions (Addendum)
Do not skip breakfast !  Try the premier Protein  Chocolate flavored shakes.  They require only refrigeration !   No crackers  Are worth the carbohydrates and calories except WASA crackers  !!   Eat something every 3 hours to stimulate your metabolism. (Fruit , dannon [employer] n [employer] [place] yogurt,  Protein shake )   Start walking 30 minutes daily   If your labs and x ray are normal.  I will recommend a trial of Lexapro to help with the hot flashes

## 2015-05-13 NOTE — Progress Notes (Signed)
Subjective:  Patient ID: Terri Andrade, female    DOB: 04-Nov-1950  Age: 64 y.o. MRN: [id number]  CC: The primary encounter diagnosis was Other fatigue. Diagnoses of Insomnia, Gastroesophageal reflux disease with esophagitis, Weight gain, Menopausal hot flushes, Breast cancer screening, Need for hepatitis C screening test, Need for prophylactic vaccination against Streptococcus pneumoniae (pneumococcus), and Overweight (BMI 25.0-29.9) were also pertinent to this visit.  HPI Terri Andrade presents for evaluation of multiple issue. :Last seen jan 2016 for asthma exacerbation .  1)  persistent heart burn and fatigue.  Daily indigestion started 6 months ago,  Started using prevacid otc, now using 40 mg  omeprazole daily with improvement in symptoms for the last 2 weeks.  needs new rx.   History of dysphagia, , had a normal EGD 2011 but Oh dilated entire esophagus anyway  Normal colonoscopy as well at that time.  Has not had labs since Oct 2015  2) Insomnia , trouble staying asleep and falling back to sleep.  Not falling asleep during the day .  Does not snore .   3) "constantly hot"  Hot all the time,  Breaking out in sweats at night an during the day.  Persistent for several months .  Tired constantly.  Not sleeping well.  Weight gain.  Big appetite. No polyuria  Feels like gong through menopause all over ,  10 years ago with hot flashes then.     Last mammgram august 2015  PAP normal HPV neg 2015 Sept   Outpatient Prescriptions Prior to Visit  Medication Sig Dispense Refill  . albuterol (PROVENTIL HFA;VENTOLIN HFA) 108 (90 BASE) MCG/ACT inhaler Inhale 2 puffs into the lungs every 6 (six) hours as needed for wheezing or shortness of breath. (Patient not taking: Reported on 05/13/2015) 1 Inhaler 11   No facility-administered medications prior to visit.    Review of Systems;  Patient denies headache, fevers, malaise, unintentional weight loss, skin rash, eye pain, sinus congestion and sinus  pain, sore throat, dysphagia,  hemoptysis , cough, dyspnea, wheezing, chest pain, palpitations, orthopnea, edema, abdominal pain, nausea, melena, diarrhea, constipation, flank pain, dysuria, hematuria, urinary  Frequency, nocturia, numbness, tingling, seizures,  Focal weakness, Loss of consciousness,  Tremor, insomnia, depression, anxiety, and suicidal ideation.      Objective:  BP 112/78 mmHg  Pulse 80  Temp(Src) 97.8 F (36.6 C) (Oral)  Resp 12  Ht 5\' 3"  (1.6 m)  Wt 168 lb 3 oz (76.289 kg)  BMI 29.80 kg/m2  SpO2 97%  BP Readings from Last 3 Encounters:  05/13/15 112/78  08/22/14 122/82  07/09/14 108/68    Wt Readings from Last 3 Encounters:  05/13/15 168 lb 3 oz (76.289 kg)  08/22/14 156 lb (70.761 kg)  07/09/14 158 lb 8 oz (71.895 kg)    General appearance: alert, cooperative and appears stated age Ears: normal TM's and external ear canals both ears Throat: lips, mucosa, and tongue normal; teeth and gums normal Neck: no adenopathy, no carotid bruit, supple, symmetrical, trachea midline and thyroid not enlarged, symmetric, no tenderness/mass/nodules Back: symmetric, no curvature. ROM normal. No CVA tenderness. Lungs: clear to auscultation bilaterally Heart: regular rate and rhythm, S1, S2 normal, no murmur, click, rub or gallop Abdomen: soft, non-tender; bowel sounds normal; no masses,  no organomegaly Pulses: 2+ and symmetric Skin: Skin color, texture, turgor normal. No rashes or lesions Lymph nodes: Cervical, supraclavicular, and axillary nodes normal.  No results found for: HGBA1C  Lab Results  Component Value Date  CREATININE 0.86 05/13/2015   CREATININE 0.84 04/10/2014   CREATININE 0.8 03/04/2013    Lab Results  Component Value Date   WBC 6.5 05/13/2015   HGB 15.2* 05/13/2015   HCT 44.5 05/13/2015   PLT 400.0 05/13/2015   GLUCOSE 83 05/13/2015   CHOL 220* 04/10/2014   TRIG 172* 04/10/2014   HDL 51 04/10/2014   LDLDIRECT 165.0 05/13/2015   LDLCALC  135* 04/10/2014   ALT 35 05/13/2015   AST 28 05/13/2015   NA 136 05/13/2015   K 4.7 05/13/2015   CL 97 05/13/2015   CREATININE 0.86 05/13/2015   BUN 20 05/13/2015   CO2 29 05/13/2015   TSH 0.84 05/13/2015    No results found.  Assessment & Plan:   Problem List Items Addressed This Visit    Overweight (BMI 25.0-29.9)    I have addressed  BMI and recommended a low glycemic index diet utilizing smaller more frequent meals to increase metabolism.  I have also recommended that patient start exercising with a goal of 30 minutes of aerobic exercise a minimum of 5 days per week. Screening for lipid disorders, thyroid and diabetes to be done today.        Insomnia     Patient  has been having some trouble sleeping.  Wakes up 2 to 3 times per night . Bedtime hygiene reviewed,  Patient has been using an electronic book to read before bed.  Does not drink caffeinated beverages after 3 PM.  Only voids  bladder once per night.  No snoring partner. Does not drink alcohol to excess.  Not exercising excessively in the evening. Patient does have a history of anxiety but does not lie awake worrying about issues that cannot be resolved. Does not take stimulants. Deberah Castle after trial of effexor and particpation in regular exercise,      Gastro-esophageal reflux    contineu omeprazole 40 mg daily  for 6 weeks,  Then will try to downgrade to H2 blockade      Relevant Medications   omeprazole (PRILOSEC) 40 MG capsule   Other Relevant Orders   H. pylori antibody, IgG (Completed)   Menopausal hot flushes    Recurrence may be due to weight gain,.  CBC , TSH, exam and CXR were all normal, making TB and lymphoma unlikely .  Discussed trial of lexapro  And effexor , will try to avoid HRT unless all other remedies fail to improve quality of life.       Relevant Orders   DG Chest 2 View (Completed)   Fatigue - Primary   Relevant Orders   Comprehensive metabolic panel (Completed)   IBC panel (Completed)     TSH (Completed)   CBC with Differential/Platelet (Completed)   Weight gain   Relevant Orders   LDL cholesterol, direct (Completed)    Other Visit Diagnoses    Breast cancer screening        Relevant Orders    MM DIGITAL SCREENING BILATERAL    Need for hepatitis C screening test        Relevant Orders    Hepatitis C antibody (Completed)    Need for prophylactic vaccination against Streptococcus pneumoniae (pneumococcus)        Relevant Orders    Pneumococcal conjugate vaccine 13-valent (Completed)      A total of 40 minutes was spent with patient more than half of which was spent in counseling patient on the above mentioned issues , reviewing and explaining recent labs and  imaging studies done, and coordination of care.  I have changed Ms. Clint omeprazole. I am also having her start on venlafaxine XR. Additionally, I am having her maintain her albuterol.  Meds ordered this encounter  Medications  . DISCONTD: omeprazole (PRILOSEC) 40 MG capsule    Sig: Take 40 mg by mouth daily.    Refill:  3  . venlafaxine XR (EFFEXOR-XR) 37.5 MG 24 hr capsule    Sig: Take 1 capsule (37.5 mg total) by mouth daily with breakfast.    Dispense:  30 capsule    Refill:  2  . omeprazole (PRILOSEC) 40 MG capsule    Sig: Take 1 capsule (40 mg total) by mouth daily.    Dispense:  30 capsule    Refill:  1    Medications Discontinued During This Encounter  Medication Reason  . omeprazole (PRILOSEC) 40 MG capsule Reorder    Follow-up: No Follow-up on file.   Crecencio Mc, MD

## 2015-05-14 LAB — HEPATITIS C ANTIBODY: HCV Ab: NEGATIVE

## 2015-05-15 ENCOUNTER — Encounter: Payer: Self-pay | Admitting: Internal Medicine

## 2015-05-15 MED ORDER — OMEPRAZOLE 40 MG PO CPDR: 40.0000 mg | DELAYED_RELEASE_CAPSULE | Freq: Every day | ORAL | Status: DC

## 2015-05-15 MED ORDER — VENLAFAXINE HCL ER 37.5 MG PO CP24: 37.5000 mg | ORAL_CAPSULE | Freq: Every day | ORAL | Status: DC

## 2015-05-15 NOTE — Assessment & Plan Note (Signed)
Recurrence may be due to weight gain,.  CBC , TSH, exam and CXR were all normal, making TB and lymphoma unlikely .  Discussed trial of lexapro  And effexor , will try to avoid HRT unless all other remedies fail to improve quality of life.

## 2015-05-15 NOTE — Assessment & Plan Note (Signed)
contineu omeprazole 40 mg daily  for 6 weeks,  Then will try to downgrade to H2 blockade

## 2015-05-15 NOTE — Assessment & Plan Note (Signed)
I have addressed  BMI and recommended a low glycemic index diet utilizing smaller more frequent meals to increase metabolism.  I have also recommended that patient start exercising with a goal of 30 minutes of aerobic exercise a minimum of 5 days per week. Screening for lipid disorders, thyroid and diabetes to be done today.   

## 2015-05-15 NOTE — Assessment & Plan Note (Signed)
Patient  has been having some trouble sleeping.  Wakes up 2 to 3 times per night . Bedtime hygiene reviewed,  Patient has been using an electronic book to read before bed.  Does not drink caffeinated beverages after 3 PM.  Only voids  bladder once per night.  No snoring partner. Does not drink alcohol to excess.  Not exercising excessively in the evening. Patient does have a history of anxiety but does not lie awake worrying about issues that cannot be resolved. Does not take stimulants. .Reassess after trial of effexor and particpation in regular exercise,

## 2015-06-29 ENCOUNTER — Other Ambulatory Visit: Payer: Self-pay | Admitting: Internal Medicine

## 2015-09-07 ENCOUNTER — Ambulatory Visit
Admission: RE | Admit: 2015-09-07 | Discharge: 2015-09-07 | Disposition: A | Discharge status: Home / Self Care | Payer: BLUE CROSS/BLUE SHIELD | Source: Ambulatory Visit | Attending: Internal Medicine | Admitting: Internal Medicine

## 2015-09-07 DIAGNOSIS — Z1239 Encounter for other screening for malignant neoplasm of breast: Secondary | ICD-10-CM | POA: Insufficient documentation

## 2015-09-07 DIAGNOSIS — Z1231 Encounter for screening mammogram for malignant neoplasm of breast: Secondary | ICD-10-CM | POA: Insufficient documentation

## 2015-11-07 ENCOUNTER — Other Ambulatory Visit: Payer: Self-pay | Admitting: Internal Medicine

## 2015-12-10 ENCOUNTER — Other Ambulatory Visit: Payer: Self-pay | Admitting: *Deleted

## 2015-12-10 MED ORDER — OMEPRAZOLE 40 MG PO CPDR: 40.0000 mg | DELAYED_RELEASE_CAPSULE | Freq: Every day | ORAL | Status: DC

## 2015-12-10 NOTE — Progress Notes (Signed)
Refill sent for 90 days.  

## 2016-03-13 ENCOUNTER — Telehealth: Payer: Self-pay | Admitting: Internal Medicine

## 2016-03-13 MED ORDER — ALBUTEROL SULFATE HFA 108 (90 BASE) MCG/ACT IN AERS: 2.0000 | INHALATION_SPRAY | Freq: Four times a day (QID) | RESPIRATORY_TRACT | 11 refills | Status: DC | PRN

## 2016-03-13 NOTE — Telephone Encounter (Signed)
Albuterol MDI refilled.

## 2016-03-13 NOTE — Telephone Encounter (Signed)
Patient has not been seen since 12/16, medication has not been filled since 2015 ok to fill?

## 2016-03-13 NOTE — Telephone Encounter (Signed)
PT called requesting a refill for albuterol (PROVENTIL HFA;VENTOLIN HFA) 108 (90 BASE) MCG/ACT inhaler. Pharmacy called pt to let her to know to call for a refill. Thank you!  Pharmacy - [id number] - [place], [address]  Call pt @ [phone]

## 2016-10-16 ENCOUNTER — Other Ambulatory Visit: Payer: Self-pay | Admitting: Internal Medicine

## 2016-11-12 ENCOUNTER — Other Ambulatory Visit: Payer: Self-pay | Admitting: Internal Medicine

## 2016-11-20 ENCOUNTER — Other Ambulatory Visit: Payer: Self-pay | Admitting: Internal Medicine

## 2016-11-21 ENCOUNTER — Telehealth: Payer: Self-pay | Admitting: Internal Medicine

## 2016-11-21 DIAGNOSIS — Z1239 Encounter for other screening for malignant neoplasm of breast: Secondary | ICD-10-CM

## 2016-11-21 NOTE — Telephone Encounter (Signed)
Pt lvm stating that she tried to schedule her mammogram at [facility] and they told her that she needed to contact us to enter an order. She would like her mammogram done before her appt with Dr. Derrel Nip on 6/25.  Pt cb [phone]

## 2016-11-21 NOTE — Telephone Encounter (Signed)
Spoke with pt to let her know that the order has been put in for the mammogram so she can go ahead and schedule the mammogram now.

## 2016-12-04 ENCOUNTER — Ambulatory Visit (INDEPENDENT_AMBULATORY_CARE_PROVIDER_SITE_OTHER): Payer: BLUE CROSS/BLUE SHIELD | Admitting: Internal Medicine

## 2016-12-04 ENCOUNTER — Encounter: Payer: Self-pay | Admitting: Internal Medicine

## 2016-12-04 ENCOUNTER — Other Ambulatory Visit (HOSPITAL_COMMUNITY)
Admission: RE | Admit: 2016-12-04 | Discharge: 2016-12-04 | Disposition: A | Discharge status: Home / Self Care | Payer: BLUE CROSS/BLUE SHIELD | Source: Ambulatory Visit | Attending: Internal Medicine | Admitting: Internal Medicine

## 2016-12-04 VITALS — BP 120/78 | HR 71 | Temp 98.3°F | Resp 16 | Ht 63.0 in | Wt 151.0 lb

## 2016-12-04 DIAGNOSIS — Z124 Encounter for screening for malignant neoplasm of cervix: Secondary | ICD-10-CM | POA: Diagnosis not present

## 2016-12-04 DIAGNOSIS — E559 Vitamin D deficiency, unspecified: Secondary | ICD-10-CM | POA: Diagnosis not present

## 2016-12-04 DIAGNOSIS — R5383 Other fatigue: Secondary | ICD-10-CM

## 2016-12-04 DIAGNOSIS — E785 Hyperlipidemia, unspecified: Secondary | ICD-10-CM

## 2016-12-04 DIAGNOSIS — K2100 Gastro-esophageal reflux disease with esophagitis, without bleeding: Secondary | ICD-10-CM

## 2016-12-04 DIAGNOSIS — E663 Overweight: Secondary | ICD-10-CM

## 2016-12-04 DIAGNOSIS — Z0000 Encounter for general adult medical examination without abnormal findings: Secondary | ICD-10-CM

## 2016-12-04 LAB — CBC WITH DIFFERENTIAL/PLATELET
BASOS PCT: 1 % (ref 0.0–3.0)
Basophils Absolute: 0.1 10*3/uL (ref 0.0–0.1)
EOS PCT: 3.5 % (ref 0.0–5.0)
Eosinophils Absolute: 0.2 10*3/uL (ref 0.0–0.7)
HEMATOCRIT: 42 % (ref 36.0–46.0)
HEMOGLOBIN: 14.5 g/dL (ref 12.0–15.0)
LYMPHS PCT: 31.1 % (ref 12.0–46.0)
Lymphs Abs: 2 10*3/uL (ref 0.7–4.0)
MCHC: 34.6 g/dL (ref 30.0–36.0)
MCV: 96.9 fl (ref 78.0–100.0)
MONOS PCT: 6.9 % (ref 3.0–12.0)
Monocytes Absolute: 0.5 10*3/uL (ref 0.1–1.0)
NEUTROS ABS: 3.8 10*3/uL (ref 1.4–7.7)
Neutrophils Relative %: 57.5 % (ref 43.0–77.0)
PLATELETS: 370 10*3/uL (ref 150.0–400.0)
RBC: 4.33 Mil/uL (ref 3.87–5.11)
RDW: 11.7 % (ref 11.5–15.5)
WBC: 6.6 10*3/uL (ref 4.0–10.5)

## 2016-12-04 LAB — LIPID PANEL
CHOLESTEROL: 242 mg/dL — AB (ref 0–200)
HDL: 55.7 mg/dL (ref >39.00)
LDL Cholesterol: 147 mg/dL — ABNORMAL HIGH (ref 0–99)
NonHDL: 186.14
TRIGLYCERIDES: 197 mg/dL — AB (ref 0.0–149.0)
Total CHOL/HDL Ratio: 4
VLDL: 39.4 mg/dL (ref 0.0–40.0)

## 2016-12-04 LAB — COMPREHENSIVE METABOLIC PANEL
ALBUMIN: 4.4 g/dL (ref 3.5–5.2)
ALT: 25 U/L (ref 0–35)
AST: 21 U/L (ref 0–37)
Alkaline Phosphatase: 71 U/L (ref 39–117)
BILIRUBIN TOTAL: 0.4 mg/dL (ref 0.2–1.2)
BUN: 19 mg/dL (ref 6–23)
CALCIUM: 10.1 mg/dL (ref 8.4–10.5)
CO2: 31 meq/L (ref 19–32)
CREATININE: 0.88 mg/dL (ref 0.40–1.20)
Chloride: 99 mEq/L (ref 96–112)
GFR: 68.66 mL/min (ref >60.00)
Glucose, Bld: 96 mg/dL (ref 70–99)
Potassium: 4.3 mEq/L (ref 3.5–5.1)
Sodium: 138 mEq/L (ref 135–145)
Total Protein: 7.7 g/dL (ref 6.0–8.3)

## 2016-12-04 LAB — TSH: TSH: 0.63 u[IU]/mL (ref 0.35–4.50)

## 2016-12-04 LAB — VITAMIN D 25 HYDROXY (VIT D DEFICIENCY, FRACTURES): VITD: 27.73 ng/mL — ABNORMAL LOW (ref 30.00–100.00)

## 2016-12-04 NOTE — Progress Notes (Signed)
Patient ID: Terri Andrade, female    DOB: [birth year]  Age: 66 y.o. MRN: [id number]  The patient is here for annual preventive  examination and management of other chronic  Problems.  NO ASTHMA EXACERBATIONS  In the past year. Usually happens annually in the spring   TAKING [place] NO HISTO YOF BARRET''S   MAMMOGRAM 3/17 NORMAL  COLONOSCOPY 9/11 5 YR FOLLOW UP FOR FAMILY HISTORY  PAP 9/14  NORMAL HIV SCREEN DUE  The risk factors are reflected in the social history.  The roster of all physicians providing medical care to patient - is listed in the Snapshot section of the chart.  Activities of daily living:  The patient is 100% independent in all ADLs: dressing, toileting, feeding as well as independent mobility  Home safety : The patient has smoke detectors in the home. They wear seatbelts.  There are no firearms at home. There is no violence in the home.   There is no risks for hepatitis, STDs or HIV. There is no   history of blood transfusion. They have no travel history to infectious disease endemic areas of the world.  The patient has seen their dentist in the last six month. They have seen their eye doctor in the last year. T    They do not  have excessive sun exposure. Discussed the need for sun protection: hats, long sleeves and use of sunscreen if there is significant sun exposure.   Diet: the importance of a healthy diet is discussed. They do have a healthy diet.  The benefits of regular aerobic exercise were discussed. She walks 4 times per week ,  20 minutes.   Depression screen: there are no signs or vegative symptoms of depression- irritability, change in appetite, anhedonia, sadness/tearfullness.  The following portions of the patient's history were reviewed and updated as appropriate: allergies, current medications, past family history, past medical history,  past surgical history, past social history  and problem list.  Visual acuity was not assessed per patient  preference since she has regular follow up with her ophthalmologist. Hearing and body mass index were assessed and reviewed.   During the course of the visit the patient was educated and counseled about appropriate screening and preventive services including : fall prevention , diabetes screening, nutrition counseling, colorectal cancer screening, and recommended immunizations.    CC: The primary encounter diagnosis was Pap smear for cervical cancer screening. Diagnoses of Fatigue, unspecified type, Hyperlipidemia LDL goal <130, Vitamin D deficiency, Gastroesophageal reflux disease with esophagitis, Encounter for preventive health examination, and Overweight (BMI 25.0-29.9) were also pertinent to this visit.  History Terri Andrade has a past medical history of Lump or mass in breast (2014) and Treadmill stress test negative for angina pectoris (2009).   She has a past surgical history that includes Spine surgery (2007); Cholecystectomy (2004); Tubal ligation; and Breast biopsy (Right, 12-25-2012).   Her family history includes Cancer in her brother; Cancer (age of onset: 42) in her father; Mental illness in her mother.She reports that she has never smoked. She has never used smokeless tobacco. She reports that she drinks alcohol. She reports that she does not use drugs.  Outpatient Medications Prior to Visit  Medication Sig Dispense Refill  . albuterol (PROVENTIL HFA;VENTOLIN HFA) 108 (90 Base) MCG/ACT inhaler Inhale 2 puffs into the lungs every 6 (six) hours as needed for wheezing or shortness of breath. 1 Inhaler 11  . omeprazole (PRILOSEC) 40 MG capsule Take 1 capsule (40 mg total) by  mouth daily. 30 capsule 1  . omeprazole (PRILOSEC) 40 MG capsule TAKE 1 CAPSULE BY MOUTH EVERY DAY**NEEDS OFFICE VISIT** (Patient not taking: Reported on 12/04/2016) 30 capsule 0  . venlafaxine XR (EFFEXOR-XR) 37.5 MG 24 hr capsule Take 1 capsule (37.5 mg total) by mouth daily with breakfast. (Patient not taking: Reported on  12/04/2016) 30 capsule 2   No facility-administered medications prior to visit.     Review of Systems   Patient denies headache, fevers, malaise, unintentional weight loss, skin rash, eye pain, sinus congestion and sinus pain, sore throat, dysphagia,  hemoptysis , cough, dyspnea, wheezing, chest pain, palpitations, orthopnea, edema, abdominal pain, nausea, melena, diarrhea, constipation, flank pain, dysuria, hematuria, urinary  Frequency, nocturia, numbness, tingling, seizures,  Focal weakness, Loss of consciousness,  Tremor, insomnia, depression, anxiety, and suicidal ideation.      Objective:  BP 120/78 (BP Location: Left Arm, Patient Position: Sitting, Cuff Size: Normal)   Pulse 71   Temp 98.3 F (36.8 C) (Oral)   Resp 16   Ht 5\' 3"  (1.6 m)   Wt 151 lb (68.5 kg)   SpO2 96%   BMI 26.75 kg/m   Physical Exam   General appearance: alert, cooperative and appears stated age Head: Normocephalic, without obvious abnormality, atraumatic Eyes: conjunctivae/corneas clear. PERRL, EOM's intact. Fundi benign. Ears: normal TM's and external ear canals both ears Nose: Nares normal. Septum midline. Mucosa normal. No drainage or sinus tenderness. Throat: lips, mucosa, and tongue normal; teeth and gums normal Neck: no adenopathy, no carotid bruit, no JVD, supple, symmetrical, trachea midline and thyroid not enlarged, symmetric, no tenderness/mass/nodules Lungs: clear to auscultation bilaterally Breasts: normal appearance, no masses or tenderness Heart: regular rate and rhythm, S1, S2 normal, no murmur, click, rub or gallop Abdomen: soft, non-tender; bowel sounds normal; no masses,  no organomegaly Extremities: extremities normal, atraumatic, no cyanosis or edema Pulses: 2+ and symmetric Skin: Skin color, texture, turgor normal. No rashes or lesions Neurologic: Alert and oriented X 3, normal strength and tone. Normal symmetric reflexes. Normal coordination and gait.      Assessment & Plan:    Problem List Items Addressed This Visit    Overweight (BMI 25.0-29.9)    I have addressed  BMI and recommended a low glycemic index diet utilizing smaller more frequent meals to increase metabolism.  I have also recommended that patient start exercising with a goal of 30 minutes of aerobic exercise a minimum of 5 days per week. Screening for lipid disorders, thyroid and diabetes to be done today.  Lab Results  Component Value Date   CHOL 242 (H) 12/04/2016   HDL 55.70 12/04/2016   LDLCALC 147 (H) 12/04/2016   LDLDIRECT 165.0 05/13/2015   TRIG 197.0 (H) 12/04/2016   CHOLHDL 4 12/04/2016   Lab Results  Component Value Date   TSH 0.63 12/04/2016   No results found for: HGBA1C       Gastro-esophageal reflux    Managed with prilosec. The risks of long term PPI use for acid suppression in patients without documented Barretts esophagus were discussed, including the possibility of osteoporosis, iron , B12 and magnesium deficiencies,  CKD, and dementia. Suggested trial of pepcid 20 mg daily and if GERD symptoms return,  To resume daily PPI and  referral for EGD.        Fatigue   Relevant Orders   Comprehensive metabolic panel (Completed)   TSH (Completed)   HIV antibody (Completed)   CBC with Differential/Platelet (Completed)   Encounter for  preventive health examination    Annual comprehensive preventive exam was done as well as an evaluation and management of chronic conditions .  During the course of the visit the patient was educated and counseled about appropriate screening and preventive services including :  diabetes screening, lipid analysis with projected  10 year  risk for CAD , nutrition counseling, breast, cervical and colorectal cancer screening, and recommended immunizations.  Printed recommendations for health maintenance screenings was give       Other Visit Diagnoses    Pap smear for cervical cancer screening    -  Primary   Relevant Orders   Cytology - PAP  (Completed)   Hyperlipidemia LDL goal <130       Relevant Orders   Lipid panel (Completed)   Vitamin D deficiency       Relevant Orders   VITAMIN D 25 Hydroxy (Vit-D Deficiency, Fractures) (Completed)      I have discontinued Ms. Skilton venlafaxine XR. I am also having her maintain her omeprazole and albuterol.  No orders of the defined types were placed in this encounter.   Medications Discontinued During This Encounter  Medication Reason  . omeprazole (PRILOSEC) 40 MG capsule Duplicate  . venlafaxine XR (EFFEXOR-XR) 37.5 MG 24 hr capsule Patient has not taken in last 30 days    Follow-up: No Follow-up on file.   Crecencio Mc, MD

## 2016-12-04 NOTE — Patient Instructions (Addendum)
The ShingRx vaccine will be available in  LOCAL PHARMACIES and IS ADVISED for all interested adults over 50 to prevent shingles    We discussed my concern about continuing your PPI (Prilosec) in light of the recently published studies suggesting an association with increased risk of dementia and kidney failure.  I advised  you to try using  famotidine 20 mg  ONCE OR TWICE daily,availabel OTC at [facility].    if your reflux symptoms are controlled,  You can Continue the daily h2 blocker. If not,  We will resume a covered PPI .  You had your LAST PAP SMEAR!!!    Health Maintenance for Postmenopausal Women Menopause is a normal process in which your reproductive ability comes to an end. This process happens gradually over a span of months to years, usually between the ages of 69 and 38. Menopause is complete when you have missed 12 consecutive menstrual periods. It is important to talk with your health care provider about some of the most common conditions that affect postmenopausal women, such as heart disease, cancer, and bone loss (osteoporosis). Adopting a healthy lifestyle and getting preventive care can help to promote your health and wellness. Those actions can also lower your chances of developing some of these common conditions. What should I know about menopause? During menopause, you may experience a number of symptoms, such as:  Moderate-to-severe hot flashes.  Night sweats.  Decrease in sex drive.  Mood swings.  Headaches.  Tiredness.  Irritability.  Memory problems.  Insomnia.  Choosing to treat or not to treat menopausal changes is an individual decision that you make with your health care provider. What should I know about hormone replacement therapy and supplements? Hormone therapy products are effective for treating symptoms that are associated with menopause, such as hot flashes and night sweats. Hormone replacement carries certain risks, especially as you become older.  If you are thinking about using estrogen or estrogen with progestin treatments, discuss the benefits and risks with your health care provider. What should I know about heart disease and stroke? Heart disease, heart attack, and stroke become more likely as you age. This may be due, in part, to the hormonal changes that your body experiences during menopause. These can affect how your body processes dietary fats, triglycerides, and cholesterol. Heart attack and stroke are both medical emergencies. There are many things that you can do to help prevent heart disease and stroke:  Have your blood pressure checked at least every 1-2 years. High blood pressure causes heart disease and increases the risk of stroke.  If you are 34-73 years old, ask your health care provider if you should take aspirin to prevent a heart attack or a stroke.  Do not use any tobacco products, including cigarettes, chewing tobacco, or electronic cigarettes. If you need help quitting, ask your health care provider.  It is important to eat a healthy diet and maintain a healthy weight. ? Be sure to include plenty of vegetables, fruits, low-fat dairy products, and lean protein. ? Avoid eating foods that are high in solid fats, added sugars, or salt (sodium).  Get regular exercise. This is one of the most important things that you can do for your health. ? Try to exercise for at least 150 minutes each week. The type of exercise that you do should increase your heart rate and make you sweat. This is known as moderate-intensity exercise. ? Try to do strengthening exercises at least twice each week. Do these in  addition to the moderate-intensity exercise.  Know your numbers.Ask your health care provider to check your cholesterol and your blood glucose. Continue to have your blood tested as directed by your health care provider.  What should I know about cancer screening? There are several types of cancer. Take the following steps to  reduce your risk and to catch any cancer development as early as possible. Breast Cancer  Practice breast self-awareness. ? This means understanding how your breasts normally appear and feel. ? It also means doing regular breast self-exams. Let your health care provider know about any changes, no matter how small.  If you are 39 or older, have a clinician do a breast exam (clinical breast exam or CBE) every year. Depending on your age, family history, and medical history, it may be recommended that you also have a yearly breast X-ray (mammogram).  If you have a family history of breast cancer, talk with your health care provider about genetic screening.  If you are at high risk for breast cancer, talk with your health care provider about having an MRI and a mammogram every year.  Breast cancer (BRCA) gene test is recommended for women who have family members with BRCA-related cancers. Results of the assessment will determine the need for genetic counseling and BRCA1 and for BRCA2 testing. BRCA-related cancers include these types: ? Breast. This occurs in males or females. ? Ovarian. ? Tubal. This may also be called fallopian tube cancer. ? Cancer of the abdominal or pelvic lining (peritoneal cancer). ? Prostate. ? Pancreatic.  Cervical, Uterine, and Ovarian Cancer Your health care provider may recommend that you be screened regularly for cancer of the pelvic organs. These include your ovaries, uterus, and vagina. This screening involves a pelvic exam, which includes checking for microscopic changes to the surface of your cervix (Pap test).  For women ages 21-65, health care providers may recommend a pelvic exam and a Pap test every three years. For women ages 49-65, they may recommend the Pap test and pelvic exam, combined with testing for human papilloma virus (HPV), every five years. Some types of HPV increase your risk of cervical cancer. Testing for HPV may also be done on women of any  age who have unclear Pap test results.  Other health care providers may not recommend any screening for nonpregnant women who are considered low risk for pelvic cancer and have no symptoms. Ask your health care provider if a screening pelvic exam is right for you.  If you have had past treatment for cervical cancer or a condition that could lead to cancer, you need Pap tests and screening for cancer for at least 20 years after your treatment. If Pap tests have been discontinued for you, your risk factors (such as having a new sexual partner) need to be reassessed to determine if you should start having screenings again. Some women have medical problems that increase the chance of getting cervical cancer. In these cases, your health care provider may recommend that you have screening and Pap tests more often.  If you have a family history of uterine cancer or ovarian cancer, talk with your health care provider about genetic screening.  If you have vaginal bleeding after reaching menopause, tell your health care provider.  There are currently no reliable tests available to screen for ovarian cancer.  Lung Cancer Lung cancer screening is recommended for adults 31-29 years old who are at high risk for lung cancer because of a history of smoking. A  yearly low-dose CT scan of the lungs is recommended if you:  Currently smoke.  Have a history of at least 30 pack-years of smoking and you currently smoke or have quit within the past 15 years. A pack-year is smoking an average of one pack of cigarettes per day for one year.  Yearly screening should:  Continue until it has been 15 years since you quit.  Stop if you develop a health problem that would prevent you from having lung cancer treatment.  Colorectal Cancer  This type of cancer can be detected and can often be prevented.  Routine colorectal cancer screening usually begins at age 64 and continues through age 37.  If you have risk factors  for colon cancer, your health care provider may recommend that you be screened at an earlier age.  If you have a family history of colorectal cancer, talk with your health care provider about genetic screening.  Your health care provider may also recommend using home test kits to check for hidden blood in your stool.  A small camera at the end of a tube can be used to examine your colon directly (sigmoidoscopy or colonoscopy). This is done to check for the earliest forms of colorectal cancer.  Direct examination of the colon should be repeated every 5-10 years until age 68. However, if early forms of precancerous polyps or small growths are found or if you have a family history or genetic risk for colorectal cancer, you may need to be screened more often.  Skin Cancer  Check your skin from head to toe regularly.  Monitor any moles. Be sure to tell your health care provider: ? About any new moles or changes in moles, especially if there is a change in a mole's shape or color. ? If you have a mole that is larger than the size of a pencil eraser.  If any of your family members has a history of skin cancer, especially at a young age, talk with your health care provider about genetic screening.  Always use sunscreen. Apply sunscreen liberally and repeatedly throughout the day.  Whenever you are outside, protect yourself by wearing long sleeves, pants, a wide-brimmed hat, and sunglasses.  What should I know about osteoporosis? Osteoporosis is a condition in which bone destruction happens more quickly than new bone creation. After menopause, you may be at an increased risk for osteoporosis. To help prevent osteoporosis or the bone fractures that can happen because of osteoporosis, the following is recommended:  If you are 52-43 years old, get at least 1,000 mg of calcium and at least 600 mg of vitamin D per day.  If you are older than age 66 but younger than age 83, get at least 1,200 mg of  calcium and at least 600 mg of vitamin D per day.  If you are older than age 61, get at least 1,200 mg of calcium and at least 800 mg of vitamin D per day.  Smoking and excessive alcohol intake increase the risk of osteoporosis. Eat foods that are rich in calcium and vitamin D, and do weight-bearing exercises several times each week as directed by your health care provider. What should I know about how menopause affects my mental health? Depression may occur at any age, but it is more common as you become older. Common symptoms of depression include:  Low or sad mood.  Changes in sleep patterns.  Changes in appetite or eating patterns.  Feeling an overall lack of motivation or  enjoyment of activities that you previously enjoyed.  Frequent crying spells.  Talk with your health care provider if you think that you are experiencing depression. What should I know about immunizations? It is important that you get and maintain your immunizations. These include:  Tetanus, diphtheria, and pertussis (Tdap) booster vaccine.  Influenza every year before the flu season begins.  Pneumonia vaccine.  Shingles vaccine.  Your health care provider may also recommend other immunizations. This information is not intended to replace advice given to you by your health care provider. Make sure you discuss any questions you have with your health care provider. Document Released: 07/21/2005 Document Revised: 12/17/2015 Document Reviewed: 03/02/2015 Elsevier Interactive Patient Education  2018 [employer].

## 2016-12-05 ENCOUNTER — Ambulatory Visit
Admission: RE | Admit: 2016-12-05 | Discharge: 2016-12-05 | Disposition: A | Discharge status: Home / Self Care | Payer: BLUE CROSS/BLUE SHIELD | Source: Ambulatory Visit | Attending: Internal Medicine | Admitting: Internal Medicine

## 2016-12-05 DIAGNOSIS — Z1231 Encounter for screening mammogram for malignant neoplasm of breast: Secondary | ICD-10-CM | POA: Diagnosis not present

## 2016-12-05 DIAGNOSIS — Z1239 Encounter for other screening for malignant neoplasm of breast: Secondary | ICD-10-CM

## 2016-12-05 LAB — CYTOLOGY - PAP
Diagnosis: NEGATIVE
HPV: NOT DETECTED

## 2016-12-05 LAB — HIV ANTIBODY (ROUTINE TESTING W REFLEX): HIV 1&2 Ab, 4th Generation: NONREACTIVE

## 2016-12-05 NOTE — Assessment & Plan Note (Signed)
Managed with prilosec. The risks of long term PPI use for acid suppression in patients without documented Barretts esophagus were discussed, including the possibility of osteoporosis, iron , B12 and magnesium deficiencies,  CKD, and dementia. Suggested trial of pepcid 20 mg daily and if GERD symptoms return,  To resume daily PPI and  referral for EGD.

## 2016-12-05 NOTE — Assessment & Plan Note (Signed)
Annual comprehensive preventive exam was done as well as an evaluation and management of chronic conditions .  During the course of the visit the patient was educated and counseled about appropriate screening and preventive services including :  diabetes screening, lipid analysis with projected  10 year  risk for CAD , nutrition counseling, breast, cervical and colorectal cancer screening, and recommended immunizations.  Printed recommendations for health maintenance screenings was give 

## 2016-12-05 NOTE — Assessment & Plan Note (Signed)
I have addressed  BMI and recommended a low glycemic index diet utilizing smaller more frequent meals to increase metabolism.  I have also recommended that patient start exercising with a goal of 30 minutes of aerobic exercise a minimum of 5 days per week. Screening for lipid disorders, thyroid and diabetes to be done today.  Lab Results  Component Value Date   CHOL 242 (H) 12/04/2016   HDL 55.70 12/04/2016   LDLCALC 147 (H) 12/04/2016   LDLDIRECT 165.0 05/13/2015   TRIG 197.0 (H) 12/04/2016   CHOLHDL 4 12/04/2016   Lab Results  Component Value Date   TSH 0.63 12/04/2016   No results found for: HGBA1C

## 2016-12-06 ENCOUNTER — Encounter: Payer: Self-pay | Admitting: Internal Medicine

## 2016-12-17 ENCOUNTER — Other Ambulatory Visit: Payer: Self-pay | Admitting: Internal Medicine

## 2017-03-07 NOTE — Telephone Encounter (Signed)
Error

## 2017-03-26 ENCOUNTER — Encounter: Payer: Self-pay | Admitting: Internal Medicine

## 2017-03-27 ENCOUNTER — Ambulatory Visit (INDEPENDENT_AMBULATORY_CARE_PROVIDER_SITE_OTHER): Payer: BLUE CROSS/BLUE SHIELD

## 2017-03-27 DIAGNOSIS — Z23 Encounter for immunization: Secondary | ICD-10-CM | POA: Diagnosis not present

## 2017-04-06 ENCOUNTER — Encounter: Payer: Self-pay | Admitting: Internal Medicine

## 2017-04-06 ENCOUNTER — Other Ambulatory Visit: Payer: Self-pay | Admitting: Internal Medicine

## 2017-04-06 MED ORDER — OMEPRAZOLE 40 MG PO CPDR: DELAYED_RELEASE_CAPSULE | ORAL | 1 refills | Status: DC

## 2017-09-30 ENCOUNTER — Other Ambulatory Visit: Payer: Self-pay | Admitting: Internal Medicine

## 2018-01-28 ENCOUNTER — Other Ambulatory Visit: Payer: Self-pay | Admitting: Internal Medicine

## 2018-01-28 DIAGNOSIS — Z1231 Encounter for screening mammogram for malignant neoplasm of breast: Secondary | ICD-10-CM

## 2018-01-29 ENCOUNTER — Ambulatory Visit
Admission: RE | Admit: 2018-01-29 | Discharge: 2018-01-29 | Disposition: A | Discharge status: Home / Self Care | Payer: Medicare Other | Source: Ambulatory Visit | Attending: Internal Medicine | Admitting: Internal Medicine

## 2018-01-29 DIAGNOSIS — Z1231 Encounter for screening mammogram for malignant neoplasm of breast: Secondary | ICD-10-CM | POA: Diagnosis present

## 2018-03-27 ENCOUNTER — Other Ambulatory Visit: Payer: Self-pay | Admitting: Internal Medicine

## 2018-06-27 ENCOUNTER — Other Ambulatory Visit: Payer: Self-pay | Admitting: Internal Medicine

## 2018-09-27 ENCOUNTER — Other Ambulatory Visit: Payer: Self-pay | Admitting: Internal Medicine

## 2018-10-02 MED ORDER — OMEPRAZOLE 40 MG PO CPDR: 40.0000 mg | DELAYED_RELEASE_CAPSULE | Freq: Every day | ORAL | 2 refills | Status: DC

## 2019-02-03 ENCOUNTER — Other Ambulatory Visit: Payer: Self-pay

## 2019-02-03 ENCOUNTER — Ambulatory Visit (INDEPENDENT_AMBULATORY_CARE_PROVIDER_SITE_OTHER): Payer: Medicare Other | Admitting: Internal Medicine

## 2019-02-03 ENCOUNTER — Encounter: Payer: Self-pay | Admitting: Internal Medicine

## 2019-02-03 VITALS — BP 124/78 | HR 84 | Ht 63.0 in | Wt 157.0 lb

## 2019-02-03 DIAGNOSIS — R5383 Other fatigue: Secondary | ICD-10-CM | POA: Diagnosis not present

## 2019-02-03 DIAGNOSIS — R7301 Impaired fasting glucose: Secondary | ICD-10-CM | POA: Diagnosis not present

## 2019-02-03 DIAGNOSIS — Z1239 Encounter for other screening for malignant neoplasm of breast: Secondary | ICD-10-CM

## 2019-02-03 DIAGNOSIS — E782 Mixed hyperlipidemia: Secondary | ICD-10-CM

## 2019-02-03 DIAGNOSIS — E559 Vitamin D deficiency, unspecified: Secondary | ICD-10-CM

## 2019-02-03 DIAGNOSIS — K2100 Gastro-esophageal reflux disease with esophagitis, without bleeding: Secondary | ICD-10-CM

## 2019-02-03 DIAGNOSIS — Z79899 Other long term (current) drug therapy: Secondary | ICD-10-CM

## 2019-02-03 DIAGNOSIS — E663 Overweight: Secondary | ICD-10-CM | POA: Diagnosis not present

## 2019-02-03 DIAGNOSIS — Z1211 Encounter for screening for malignant neoplasm of colon: Secondary | ICD-10-CM

## 2019-02-03 DIAGNOSIS — Z1231 Encounter for screening mammogram for malignant neoplasm of breast: Secondary | ICD-10-CM

## 2019-02-03 NOTE — Assessment & Plan Note (Signed)
Normal colonoscopy, reportedly by Dr Candace Cruise  In 2012. Sending for records.

## 2019-02-03 NOTE — Assessment & Plan Note (Addendum)
Managed with prilosec. The risks of long term PPI use for acid suppression in patients without documented Barretts esophagus were discussed, including the possibility of osteoporosis, iron , B12 and magnesium deficiencies,  CKD, and dementia.  

## 2019-02-03 NOTE — Progress Notes (Signed)
Telephone  Note  This visit type was conducted due to national recommendations for restrictions regarding the COVID-19 pandemic (e.g. social distancing).  This format is felt to be most appropriate for this patient at this time.  All issues noted in this document were discussed and addressed.  No physical exam was performed (except for noted visual exam findings with Video Visits).   I connected with@ on 02/03/19 at  9:00 AM EDT by  telephone and verified that I am speaking with the correct person using two identifiers. Location patient: home Location provider: work or home office Persons participating in the virtual visit: patient, provider  I discussed the limitations, risks, security and privacy concerns of performing an evaluation and management service by telephone and the availability of in person appointments. I also discussed with the patient that there may be a patient responsible charge related to this service. The patient expressed understanding and agreed to proceed.  Reason for visit: follow up  HPI:    68 yr old with GERD and overweight last seen in June 2018  GERD: symptoms  controlled on prilosec 40 mg daily  Gained 6 lbs  Weight now 157 at home.  Eating and baking too much,  Swims in pool daily but mostly recreational floating  Due for cpe, mammogram, first time DEXA ,  possibly colonosocpy.  No labs since 2018   ROS: Patient denies headache, fevers, malaise, unintentional weight loss, skin rash, eye pain, sinus congestion and sinus pain, sore throat, dysphagia,  hemoptysis , cough, dyspnea, wheezing, chest pain, palpitations, orthopnea, edema, abdominal pain, nausea, melena, diarrhea, constipation, flank pain, dysuria, hematuria, urinary  Frequency, nocturia, numbness, tingling, seizures,  Focal weakness, Loss of consciousness,  Tremor, insomnia, depression, anxiety, and suicidal ideation.      Past Medical History:  Diagnosis Date  . Lump or mass in breast 2014  .  Treadmill stress test negative for angina pectoris 2009    Past Surgical History:  Procedure Laterality Date  . BREAST BIOPSY Right 12-25-2012    BENIGN BREAST TISSUE WITH FOCAL COLUMNAR CELL CHANGE, USUAL  . CHOLECYSTECTOMY  2004  . SPINE SURGERY  2007   Lumbar disk,  [name]  . TUBAL LIGATION      Family History  Problem Relation Age of Onset  . Mental illness Mother        bipolar disorder  . Cancer Father 66       tobacco related CA  . Cancer Brother        tobacco related CA  . Birth defects Neg Hx   . Breast cancer Neg Hx     SOCIAL HX:  reports that she has never smoked. She has never used smokeless tobacco. She reports current alcohol use. She reports that she does not use drugs.  Current Outpatient Medications:  .  omeprazole (PRILOSEC) 40 MG capsule, Take 1 capsule (40 mg total) by mouth daily., Disp: 90 capsule, Rfl: 2  EXAM:   General impression: alert, cooperative and articulate.  No signs of being in distress  Lungs: speech is fluent sentence length suggests that patient is not short of breath and not punctuated by cough, sneezing or sniffing. Marland Kitchen   Psych: affect normal.  speech is articulate and non pressured .  Denies suicidal thoughts   ASSESSMENT AND PLAN:  Discussed the following assessment and plan:  Overweight (BMI 25.0-29.9)  Other fatigue - Plan: TSH, CBC with Differential/Platelet  Impaired fasting glucose - Plan: Comprehensive metabolic panel, Hemoglobin A1c  Vitamin D deficiency - Plan: VITAMIN D 25 Hydroxy (Vit-D Deficiency, Fractures)  Mixed hyperlipidemia - Plan: Lipid panel  Colon cancer screening - Plan: Ambulatory referral to Gastroenterology  Long-term use of high-risk medication - Plan: Vitamin B12  Encounter for screening mammogram for breast cancer - Plan: MM 3D SCREEN BREAST BILATERAL  Screening for colon cancer  Screening for breast cancer  Gastroesophageal reflux disease with esophagitis  Screening for colon  cancer Normal colonoscopy, reportedly by Dr Candace Cruise  In 2012. Sending for records.   Screening for breast cancer Annual  Mammogram due   Gastro-esophageal reflux Managed with prilosec. The risks of long term PPI use for acid suppression in patients without documented Barretts esophagus were discussed, including the possibility of osteoporosis, iron , B12 and magnesium deficiencies,  CKD, and dementia.     I discussed the assessment and treatment plan with the patient. The patient was provided an opportunity to ask questions and all were answered. The patient agreed with the plan and demonstrated an understanding of the instructions.   The patient was advised to call back or seek an in-person evaluation if the symptoms worsen or if the condition fails to improve as anticipated.  I provided 25 minutes of non-face-to-face time during this encounter.   Crecencio Mc, MD

## 2019-02-03 NOTE — Telephone Encounter (Signed)
Spoke with pt and scheduled her for a follow up appt today.

## 2019-02-03 NOTE — Assessment & Plan Note (Signed)
Annual  Mammogram due

## 2019-02-20 ENCOUNTER — Other Ambulatory Visit: Payer: Self-pay

## 2019-02-20 ENCOUNTER — Ambulatory Visit: Payer: Medicare Other

## 2019-02-20 ENCOUNTER — Other Ambulatory Visit (INDEPENDENT_AMBULATORY_CARE_PROVIDER_SITE_OTHER): Payer: Medicare Other

## 2019-02-20 DIAGNOSIS — Z79899 Other long term (current) drug therapy: Secondary | ICD-10-CM

## 2019-02-20 DIAGNOSIS — R7301 Impaired fasting glucose: Secondary | ICD-10-CM

## 2019-02-20 DIAGNOSIS — E782 Mixed hyperlipidemia: Secondary | ICD-10-CM

## 2019-02-20 DIAGNOSIS — E559 Vitamin D deficiency, unspecified: Secondary | ICD-10-CM

## 2019-02-20 DIAGNOSIS — R5383 Other fatigue: Secondary | ICD-10-CM | POA: Diagnosis not present

## 2019-02-20 DIAGNOSIS — Z23 Encounter for immunization: Secondary | ICD-10-CM | POA: Diagnosis not present

## 2019-02-20 LAB — COMPREHENSIVE METABOLIC PANEL
ALT: 27 U/L (ref 0–35)
AST: 24 U/L (ref 0–37)
Albumin: 4 g/dL (ref 3.5–5.2)
Alkaline Phosphatase: 80 U/L (ref 39–117)
BUN: 19 mg/dL (ref 6–23)
CO2: 29 mEq/L (ref 19–32)
Calcium: 9.5 mg/dL (ref 8.4–10.5)
Chloride: 103 mEq/L (ref 96–112)
Creatinine, Ser: 0.86 mg/dL (ref 0.40–1.20)
GFR: 65.88 mL/min (ref >60.00)
Glucose, Bld: 84 mg/dL (ref 70–99)
Potassium: 3.9 mEq/L (ref 3.5–5.1)
Sodium: 141 mEq/L (ref 135–145)
Total Bilirubin: 0.4 mg/dL (ref 0.2–1.2)
Total Protein: 7.2 g/dL (ref 6.0–8.3)

## 2019-02-20 LAB — VITAMIN D 25 HYDROXY (VIT D DEFICIENCY, FRACTURES): VITD: 28.08 ng/mL — ABNORMAL LOW (ref 30.00–100.00)

## 2019-02-20 LAB — TSH: TSH: 1.72 u[IU]/mL (ref 0.35–4.50)

## 2019-02-20 LAB — LIPID PANEL
Cholesterol: 221 mg/dL — ABNORMAL HIGH (ref 0–200)
HDL: 47.3 mg/dL (ref >39.00)
NonHDL: 173.4
Total CHOL/HDL Ratio: 5
Triglycerides: 282 mg/dL — ABNORMAL HIGH (ref 0.0–149.0)
VLDL: 56.4 mg/dL — ABNORMAL HIGH (ref 0.0–40.0)

## 2019-02-20 LAB — CBC WITH DIFFERENTIAL/PLATELET
Basophils Absolute: 0.1 10*3/uL (ref 0.0–0.1)
Basophils Relative: 1 % (ref 0.0–3.0)
Eosinophils Absolute: 0.3 10*3/uL (ref 0.0–0.7)
Eosinophils Relative: 4.3 % (ref 0.0–5.0)
HCT: 41.9 % (ref 36.0–46.0)
Hemoglobin: 14.3 g/dL (ref 12.0–15.0)
Lymphocytes Relative: 36.8 % (ref 12.0–46.0)
Lymphs Abs: 2.4 10*3/uL (ref 0.7–4.0)
MCHC: 34.1 g/dL (ref 30.0–36.0)
MCV: 98.1 fl (ref 78.0–100.0)
Monocytes Absolute: 0.5 10*3/uL (ref 0.1–1.0)
Monocytes Relative: 7.6 % (ref 3.0–12.0)
Neutro Abs: 3.3 10*3/uL (ref 1.4–7.7)
Neutrophils Relative %: 50.3 % (ref 43.0–77.0)
Platelets: 346 10*3/uL (ref 150.0–400.0)
RBC: 4.27 Mil/uL (ref 3.87–5.11)
RDW: 11.9 % (ref 11.5–15.5)
WBC: 6.5 10*3/uL (ref 4.0–10.5)

## 2019-02-20 LAB — HEMOGLOBIN A1C: Hgb A1c MFr Bld: 5.9 % (ref 4.6–6.5)

## 2019-02-20 LAB — LDL CHOLESTEROL, DIRECT: Direct LDL: 146 mg/dL

## 2019-02-20 LAB — VITAMIN B12: Vitamin B-12: 936 pg/mL — ABNORMAL HIGH (ref 211–911)

## 2019-03-24 ENCOUNTER — Encounter: Payer: Self-pay | Admitting: Internal Medicine

## 2019-03-24 ENCOUNTER — Other Ambulatory Visit: Payer: Self-pay

## 2019-03-24 ENCOUNTER — Ambulatory Visit (INDEPENDENT_AMBULATORY_CARE_PROVIDER_SITE_OTHER): Payer: Medicare Other | Admitting: Internal Medicine

## 2019-03-24 VITALS — BP 118/80 | HR 87 | Temp 97.3°F | Resp 15 | Ht 63.0 in | Wt 155.8 lb

## 2019-03-24 DIAGNOSIS — E785 Hyperlipidemia, unspecified: Secondary | ICD-10-CM | POA: Diagnosis not present

## 2019-03-24 DIAGNOSIS — Z23 Encounter for immunization: Secondary | ICD-10-CM

## 2019-03-24 DIAGNOSIS — Z1231 Encounter for screening mammogram for malignant neoplasm of breast: Secondary | ICD-10-CM | POA: Diagnosis not present

## 2019-03-24 DIAGNOSIS — N904 Leukoplakia of vulva: Secondary | ICD-10-CM | POA: Insufficient documentation

## 2019-03-24 DIAGNOSIS — E663 Overweight: Secondary | ICD-10-CM

## 2019-03-24 DIAGNOSIS — D126 Benign neoplasm of colon, unspecified: Secondary | ICD-10-CM | POA: Diagnosis not present

## 2019-03-24 DIAGNOSIS — Z1211 Encounter for screening for malignant neoplasm of colon: Secondary | ICD-10-CM

## 2019-03-24 DIAGNOSIS — L292 Pruritus vulvae: Secondary | ICD-10-CM | POA: Insufficient documentation

## 2019-03-24 MED ORDER — ZOSTER VAC RECOMB ADJUVANTED 50 MCG/0.5ML IM SUSR: 0.5000 mL | Freq: Once | INTRAMUSCULAR | 1 refills | Status: AC

## 2019-03-24 NOTE — Assessment & Plan Note (Addendum)
10 yr risk for CAD using FRC is 11%   REd Yeast Rice recommended

## 2019-03-24 NOTE — Assessment & Plan Note (Signed)
I have addressed  BMI and recommended a low glycemic index diet utilizing smaller more frequent meals to increase metabolism.  I have also recommended that patient start exercising with a goal of 30 minutes of aerobic exercise a minimum of 5 days per week. Screening for lipid disorders, thyroid and diabetes to be done today.   

## 2019-03-24 NOTE — Patient Instructions (Addendum)
I am referring you to [facility] for your colonsocopy Referral to Dr Larey Days for vulvar itching presumed to be "Lichen Sclerosis et atrophicans"  Your recent labs were normal.   Based on your fasting cholesterol ,  your 10 year risk of having some type of vascular  event (including heart attack and stroke ) is mildly elevated at 11%, meaning one of of every 9 women with the same medical statistics will have some kind of "event" (including heart attack and stroke) within the next 10 years.    The [employer] of Card[employer] recommends starting DISCUSSIONS with  patients with this level of risk  About the merits of low   intensity statin therapy to lower your risks of these events.      The natural remedies for cholesterol have not been proven to reduce your risk for a heart attack.  Red Yeast Rice has not been proven either,  But it  does lower cholesterol, so if you want to try it , the dose is 600 mg twice daily in capsule form, available OTC. It can also affect liver enzymes so you should have our liver enzymes repeated after 6 weeks of therapy.       The ShingRx vaccine is now available in local pharmacies and is much more protective than the old one  Zostavax  (it is about 97%  Effective in preventing shingles). .   It is therefore ADVISED for all interested adults over 50 to prevent shingles so I have printed you a prescription for it.  (it requires a 2nd dose 2 to 6 months after the first one) .  It will cause you to have flu  like symptoms for 2 days If your pharmacy is not available to give it to you,  The [facility] pharmacy will give it to you.  They are located on the ground floor of the [facility] clinic   You cannot have it on the same day as your high dose flu vaccine

## 2019-03-24 NOTE — Assessment & Plan Note (Signed)
Records not available but per Kreg Shropshire she was due for follow u p in 2016  . Referral to dr Allen Norris

## 2019-03-24 NOTE — Assessment & Plan Note (Signed)
Suspect lichen sclerosis et atrophicans.  Referral to [facility] planned for biopsy

## 2019-03-24 NOTE — Assessment & Plan Note (Signed)
Breasts normal.  Mammogram tomorrow

## 2019-03-24 NOTE — Progress Notes (Signed)
Patient ID: Terri Andrade, female    DOB: 26-Jun-1950  Age: 68 y.o. MRN: [id number]  The patient is here for follow up and  management of other chronic and acute problems.  coloNoscopy is apparently overdue since  2016  .  No records availabe from [facility]     The risk factors are reflected in the social history.  The roster of all physicians providing medical care to patient - is listed in the Snapshot section of the chart.  Activities of daily living:  The patient is 100% independent in all ADLs: dressing, toileting, feeding as well as independent mobility  Home safety : The patient has smoke detectors in the home. They wear seatbelts.  There are no firearms at home. There is no violence in the home.   There is no risks for hepatitis, STDs or HIV. There is no   history of blood transfusion. They have no travel history to infectious disease endemic areas of the world.  The patient has seen their dentist in the last six month. They have seen their eye doctor in the last year. They admit to slight hearing difficulty with regard to whispered voices and some television programs.  They have deferred audiologic testing in the last year.  They do not  have excessive sun exposure. Discussed the need for sun protection: hats, long sleeves and use of sunscreen if there is significant sun exposure.   Diet: the importance of a healthy diet is discussed. They do have a healthy diet.  The benefits of regular aerobic exercise were discussed. She walks 4 times per week ,  20 minutes.   Depression screen: there are no signs or vegative symptoms of depression- irritability, change in appetite, anhedonia, sadness/tearfullness.  Cognitive assessment: the patient manages all their financial and personal affairs and is actively engaged. They could relate day,date,year and events; recalled 2/3 objects at 3 minutes; performed clock-face test normally.  The following portions of the patient's  history were reviewed and updated as appropriate: allergies, current medications, past family history, past medical history,  past surgical history, past social history  and problem list.  Visual acuity was not assessed per patient preference since she has regular follow up with her ophthalmologist. Hearing and body mass index were assessed and reviewed.   During the course of the visit the patient was educated and counseled about appropriate screening and preventive services including : fall prevention , diabetes screening, nutrition counseling, colorectal cancer screening, and recommended immunizations.    CC: The primary encounter diagnosis was Adenomatous polyp of colon, unspecified part of colon. Diagnoses of Vulvar itching, Hyperlipidemia LDL goal <160, Encounter for screening mammogram for malignant neoplasm of breast, Screening for colon cancer, Need for 23-polyvalent pneumococcal polysaccharide vaccine, and Overweight (BMI 25.0-29.9) were also pertinent to this visit.  Vulvar itching has been going on for a  Few months..  Very intense itching    [facility].   History Terri Andrade has a past medical history of Lump or mass in breast (2014) and Treadmill stress test negative for angina pectoris (2009).   She has a past surgical history that includes Spine surgery (2007); Cholecystectomy (2004); Tubal ligation; and Breast biopsy (Right, 12-25-2012).   Her family history includes Cancer in her brother; Cancer (age of onset: 73) in her father; Mental illness in her mother.She reports that she has never smoked. She has never used smokeless tobacco. She reports current alcohol use. She reports  that she does not use drugs.  Outpatient Medications Prior to Visit  Medication Sig Dispense Refill  . omeprazole (PRILOSEC) 40 MG capsule Take 1 capsule (40 mg total) by mouth daily. 90 capsule 2   No facility-administered medications prior to visit.     Review of Systems     Patient denies headache, fevers, malaise, unintentional weight loss, skin rash, eye pain, sinus congestion and sinus pain, sore throat, dysphagia,  hemoptysis , cough, dyspnea, wheezing, chest pain, palpitations, orthopnea, edema, abdominal pain, nausea, melena, diarrhea, constipation, flank pain, dysuria, hematuria, urinary  Frequency, nocturia, numbness, tingling, seizures,  Focal weakness, Loss of consciousness,  Tremor, insomnia, depression, anxiety, and suicidal ideation.      Objective:  BP 118/80 (BP Location: Left Arm, Patient Position: Sitting, Cuff Size: Normal)   Pulse 87   Temp (!) 97.3 F (36.3 C) (Temporal)   Resp 15   Ht 5\' 3"  (1.6 m)   Wt 155 lb 12.8 oz (70.7 kg)   SpO2 97%   BMI 27.60 kg/m   Physical Exam   .General appearance: alert, cooperative and appears stated age Head: Normocephalic, without obvious abnormality, atraumatic Eyes: conjunctivae/corneas clear. PERRL, EOM's intact. Fundi benign. Ears: normal TM's and external ear canals both ears Nose: Nares normal. Septum midline. Mucosa normal. No drainage or sinus tenderness. Throat: lips, mucosa, and tongue normal; teeth and gums normal Neck: no adenopathy, no carotid bruit, no JVD, supple, symmetrical, trachea midline and thyroid not enlarged, symmetric, no tenderness/mass/nodules Lungs: clear to auscultation bilaterally Breasts: normal appearance, no masses or tenderness Heart: regular rate and rhythm, S1, S2 normal, no murmur, click, rub or gallop Abdomen: soft, non-tender; bowel sounds normal; no masses,  no organomegaly Extremities: extremities normal, atraumatic, no cyanosis or edema Pulses: 2+ and symmetric Skin: Skin color, texture, turgor normal. No rashes or lesions Neurologic: Alert and oriented X 3, normal strength and tone. Normal symmetric reflexes. Normal coordination and gait.     Assessment & Plan:   Problem List Items Addressed This Visit      Unprioritized   Screening for colon  cancer    Records not available but per Kreg Shropshire she was due for follow u p in 2016  . Referral to dr Allen Norris       Screening for breast cancer    Breasts normal.  Mammogram tomorrow       Overweight (BMI 25.0-29.9)    I have addressed  BMI and recommended a low glycemic index diet utilizing smaller more frequent meals to increase metabolism.  I have also recommended that patient start exercising with a goal of 30 minutes of aerobic exercise a minimum of 5 days per week. Screening for lipid disorders, thyroid and diabetes to be done today.        Hyperlipidemia LDL goal <160    10 yr risk for CAD using FRC is 11%   REd Yeast Rice recommended       Vulvar itching    Suspect lichen sclerosis et atrophicans.  Referral to [facility] planned for biopsy      Relevant Orders   Ambulatory referral to Obstetrics / Gynecology    Other Visit Diagnoses    Adenomatous polyp of colon, unspecified part of colon    -  Primary   Relevant Orders   Ambulatory referral to Gastroenterology   Need for 23-polyvalent pneumococcal polysaccharide vaccine       Relevant Orders   Pneumococcal polysaccharide vaccine 23-valent greater than or equal to 2yo subcutaneous/IM (  Completed)      I am having Bridgette Habermann start on Zoster Vaccine Adjuvanted. I am also having her maintain her omeprazole.  Meds ordered this encounter  Medications  . Zoster Vaccine Adjuvanted [facility]) injection    Sig: Inject 0.5 mLs into the muscle once for 1 dose.    Dispense:  1 each    Refill:  1   A total of 25 minutes of face to face time was spent with patient more than half of which was spent in counselling about the above mentioned conditions  and coordination of care    There are no discontinued medications.  Follow-up: No follow-ups on file.   Crecencio Mc, MD

## 2019-03-25 ENCOUNTER — Ambulatory Visit
Admission: RE | Admit: 2019-03-25 | Discharge: 2019-03-25 | Disposition: A | Discharge status: Home / Self Care | Payer: Medicare Other | Source: Ambulatory Visit | Attending: Internal Medicine | Admitting: Internal Medicine

## 2019-03-25 DIAGNOSIS — Z1231 Encounter for screening mammogram for malignant neoplasm of breast: Secondary | ICD-10-CM

## 2019-04-03 ENCOUNTER — Telehealth: Payer: Self-pay

## 2019-04-03 NOTE — Telephone Encounter (Signed)
Copied from [place] [id number]. Topic: Referral - Status >> Apr 03, 2019  3:56 PM Reyne Dumas L wrote: Reason for CRM:   Pt states that Dr. Tera Helper office is telling here there is no referral on file for her.  Pt wants to know what the status of this referral is. Pt can be reached at [phone]

## 2019-06-13 ENCOUNTER — Other Ambulatory Visit: Payer: Self-pay | Admitting: Internal Medicine

## 2019-06-24 LAB — COLOGUARD: Cologuard: NEGATIVE

## 2019-06-26 ENCOUNTER — Telehealth: Payer: Self-pay | Admitting: Internal Medicine

## 2019-06-26 NOTE — Telephone Encounter (Signed)
Left message for patient to call back and schedule Medicare Annual Wellness Visit (AWV) either virtually/audio only.  No hx; please schedule at anytime with Denisa O'Brien-Blaney at [facility] for [facility] to schedule

## 2019-08-14 ENCOUNTER — Ambulatory Visit: Payer: Medicare Other | Attending: Internal Medicine

## 2019-08-14 DIAGNOSIS — Z23 Encounter for immunization: Secondary | ICD-10-CM

## 2019-08-14 NOTE — Progress Notes (Signed)
   Covid-19 Vaccination Clinic  Name:  TENEKA BATON    MRN: [id number] DOB: Nov 13, 1950  08/14/2019  Ms. Dolsen was observed post Covid-19 immunization for 15 minutes without incident. She was provided with Vaccine Information Sheet and instruction to access the V-Safe system.   Ms. Finnin was instructed to call 911 with any severe reactions post vaccine: Marland Kitchen Difficulty breathing  . Swelling of face and throat  . A fast heartbeat  . A bad rash all over body  . Dizziness and weakness

## 2019-09-04 ENCOUNTER — Ambulatory Visit: Payer: Medicare Other | Attending: Internal Medicine

## 2019-09-04 DIAGNOSIS — Z23 Encounter for immunization: Secondary | ICD-10-CM

## 2019-09-04 NOTE — Progress Notes (Signed)
   Covid-19 Vaccination Clinic  Name:  Terri Andrade    MRN: [id number] DOB: 05/04/1951  09/04/2019  [name] was observed post Covid-19 immunization for 15 minutes without incident. She was provided with Vaccine Information Sheet and instruction to access the V-Safe system.   [name] was instructed to call 911 with any severe reactions post vaccine: Marland Kitchen Difficulty breathing  . Swelling of face and throat  . A fast heartbeat  . A bad rash all over body  . Dizziness and weakness   Immunizations Administered    Name Date Dose VIS Date Route   Pfizer COVID-19 Vaccine 09/04/2019  9:05 AM 0.3 mL 05/23/2019 Intramuscular   Manufacturer: [employer]   Lot: [id number]   [place]: [id number]

## 2020-01-29 ENCOUNTER — Telehealth: Payer: Self-pay | Admitting: Internal Medicine

## 2020-01-29 NOTE — Telephone Encounter (Signed)
Left message for patient to call back and schedule Medicare Annual Wellness Visit (AWV)  ° °This should be a telephone visit only=30 minutes. ° °No hx of AWV; please schedule at anytime with Denisa O'Brien-Blaney at [place] ° ° °

## 2020-03-03 ENCOUNTER — Other Ambulatory Visit: Payer: Self-pay

## 2020-03-03 ENCOUNTER — Other Ambulatory Visit: Payer: Self-pay | Admitting: Internal Medicine

## 2020-03-03 MED ORDER — ALBUTEROL SULFATE HFA 108 (90 BASE) MCG/ACT IN AERS: 1.0000 | INHALATION_SPRAY | Freq: Four times a day (QID) | RESPIRATORY_TRACT | 2 refills | Status: DC | PRN

## 2020-05-17 ENCOUNTER — Other Ambulatory Visit: Payer: Self-pay

## 2020-05-17 ENCOUNTER — Other Ambulatory Visit: Payer: Self-pay | Admitting: Internal Medicine

## 2020-05-17 ENCOUNTER — Ambulatory Visit
Admission: RE | Admit: 2020-05-17 | Discharge: 2020-05-17 | Disposition: A | Discharge status: Home / Self Care | Payer: Medicare Other | Source: Ambulatory Visit | Attending: Internal Medicine | Admitting: Internal Medicine

## 2020-05-17 DIAGNOSIS — Z1231 Encounter for screening mammogram for malignant neoplasm of breast: Secondary | ICD-10-CM

## 2020-05-31 NOTE — Telephone Encounter (Signed)
LMTCB

## 2020-06-01 NOTE — Telephone Encounter (Signed)
See other mychart message. Pt has scheduled an appt.

## 2020-06-29 ENCOUNTER — Ambulatory Visit: Payer: Medicare Other | Admitting: Internal Medicine

## 2020-07-07 ENCOUNTER — Ambulatory Visit (INDEPENDENT_AMBULATORY_CARE_PROVIDER_SITE_OTHER): Payer: Medicare Other

## 2020-07-07 VITALS — Ht 63.0 in | Wt 160.0 lb

## 2020-07-07 DIAGNOSIS — Z0000 Encounter for general adult medical examination without abnormal findings: Secondary | ICD-10-CM | POA: Diagnosis not present

## 2020-07-07 NOTE — Patient Instructions (Addendum)
Terri Andrade , Thank you for taking time to come for your Medicare Wellness Visit. I appreciate your ongoing commitment to your health goals. Please review the following plan we discussed and let me know if I can assist you in the future.   These are the goals we discussed: Goals    . Increase physical activity     Walk for exercise       This is a list of the screening recommended for you and due dates:  Health Maintenance  Topic Date Due  . DEXA scan (bone density measurement)  Never done  . COVID-19 Vaccine (3 - Pfizer risk 4-dose series) 07/23/2020*  . Mammogram  05/17/2022  . Cologuard (Stool DNA test)  06/29/2022  . Tetanus Vaccine  03/05/2023  . Flu Shot  Completed  .  Hepatitis C: One time screening is recommended by Center for Disease Control  (CDC) for  adults born from [birth year] through 1963.   Completed  . Pneumonia vaccines  Completed  *Topic was postponed. The date shown is not the original due date.    Immunizations Immunization History  Administered Date(s) Administered  . Fluad Quad(high Dose 65+) 02/20/2019  . Influenza Split 04/01/2014, 03/20/2018  . Influenza,inj,Quad PF,6+ Mos 03/27/2017  . Influenza-Unspecified 02/27/2013, 03/10/2015  . PFIZER(Purple Top)SARS-COV-2 Vaccination 08/14/2019, 09/04/2019  . Pneumococcal Conjugate-13 05/13/2015  . Pneumococcal Polysaccharide-23 04/10/2014, 03/24/2019  . Tdap 03/04/2013  . Zoster 04/07/2012   Keep all routine maintenance appointments.   Follow up 07/20/20 @ 3:00  Advanced directives: End of life planning; Advance aging; Advanced directives discussed.  Copy of current HCPOA/Living Will requested.    Conditions/risks identified: none new  Follow up in one year for your annual wellness visit    Preventive Care 65 Years and Older, Female Preventive care refers to lifestyle choices and visits with your health care provider that can promote health and wellness. What does preventive care include?  A yearly physical  exam. This is also called an annual well check.  Dental exams once or twice a year.  Routine eye exams. Ask your health care provider how often you should have your eyes checked.  Personal lifestyle choices, including:  Daily care of your teeth and gums.  Regular physical activity.  Eating a healthy diet.  Avoiding tobacco and drug use.  Limiting alcohol use.  Practicing safe sex.  Taking low-dose aspirin every day.  Taking vitamin and mineral supplements as recommended by your health care provider. What happens during an annual well check? The services and screenings done by your health care provider during your annual well check will depend on your age, overall health, lifestyle risk factors, and family history of disease. Counseling  Your health care provider may ask you questions about your:  Alcohol use.  Tobacco use.  Drug use.  Emotional well-being.  Home and relationship well-being.  Sexual activity.  Eating habits.  History of falls.  Memory and ability to understand (cognition).  Work and work Statistician.  Reproductive health. Screening  You may have the following tests or measurements:  Height, weight, and BMI.  Blood pressure.  Lipid and cholesterol levels. These may be checked every 5 years, or more frequently if you are over 41 years old.  Skin check.  Lung cancer screening. You may have this screening every year starting at age 30 if you have a 30-pack-year history of smoking and currently smoke or have quit within the past 15 years.  Fecal occult blood test (FOBT) of the  stool. You may have this test every year starting at age 51.  Flexible sigmoidoscopy or colonoscopy. You may have a sigmoidoscopy every 5 years or a colonoscopy every 10 years starting at age 43.  Hepatitis C blood test.  Hepatitis B blood test.  Sexually transmitted disease (STD) testing.  Diabetes screening. This is done by checking your blood sugar (glucose)  after you have not eaten for a while (fasting). You may have this done every 1-3 years.  Bone density scan. This is done to screen for osteoporosis. You may have this done starting at age 27.  Mammogram. This may be done every 1-2 years. Talk to your health care provider about how often you should have regular mammograms. Talk with your health care provider about your test results, treatment options, and if necessary, the need for more tests. Vaccines  Your health care provider may recommend certain vaccines, such as:  Influenza vaccine. This is recommended every year.  Tetanus, diphtheria, and acellular pertussis (Tdap, Td) vaccine. You may need a Td booster every 10 years.  Zoster vaccine. You may need this after age 5.  Pneumococcal 13-valent conjugate (PCV13) vaccine. One dose is recommended after age 48.  Pneumococcal polysaccharide (PPSV23) vaccine. One dose is recommended after age 67. Talk to your health care provider about which screenings and vaccines you need and how often you need them. This information is not intended to replace advice given to you by your health care provider. Make sure you discuss any questions you have with your health care provider. Document Released: 06/25/2015 Document Revised: 02/16/2016 Document Reviewed: 03/30/2015 Elsevier Interactive Patient Education  2017 [employer] Prevention in the Home Falls can cause injuries. They can happen to people of all ages. There are many things you can do to make your home safe and to help prevent falls. What can I do on the outside of my home?  Regularly fix the edges of walkways and driveways and fix any cracks.  Remove anything that might make you trip as you walk through a door, such as a raised step or threshold.  Trim any bushes or trees on the path to your home.  Use bright outdoor lighting.  Clear any walking paths of anything that might make someone trip, such as rocks or  tools.  Regularly check to see if handrails are loose or broken. Make sure that both sides of any steps have handrails.  Any raised decks and porches should have guardrails on the edges.  Have any leaves, snow, or ice cleared regularly.  Use sand or salt on walking paths during winter.  Clean up any spills in your garage right away. This includes oil or grease spills. What can I do in the bathroom?  Use night lights.  Install grab bars by the toilet and in the tub and shower. Do not use towel bars as grab bars.  Use non-skid mats or decals in the tub or shower.  If you need to sit down in the shower, use a plastic, non-slip stool.  Keep the floor dry. Clean up any water that spills on the floor as soon as it happens.  Remove soap buildup in the tub or shower regularly.  Attach bath mats securely with double-sided non-slip rug tape.  Do not have throw rugs and other things on the floor that can make you trip. What can I do in the bedroom?  Use night lights.  Make sure that you have a light by your bed  that is easy to reach.  Do not use any sheets or blankets that are too big for your bed. They should not hang down onto the floor.  Have a firm chair that has side arms. You can use this for support while you get dressed.  Do not have throw rugs and other things on the floor that can make you trip. What can I do in the kitchen?  Clean up any spills right away.  Avoid walking on wet floors.  Keep items that you use a lot in easy-to-reach places.  If you need to reach something above you, use a strong step stool that has a grab bar.  Keep electrical cords out of the way.  Do not use floor polish or wax that makes floors slippery. If you must use wax, use non-skid floor wax.  Do not have throw rugs and other things on the floor that can make you trip. What can I do with my stairs?  Do not leave any items on the stairs.  Make sure that there are handrails on both  sides of the stairs and use them. Fix handrails that are broken or loose. Make sure that handrails are as long as the stairways.  Check any carpeting to make sure that it is firmly attached to the stairs. Fix any carpet that is loose or worn.  Avoid having throw rugs at the top or bottom of the stairs. If you do have throw rugs, attach them to the floor with carpet tape.  Make sure that you have a light switch at the top of the stairs and the bottom of the stairs. If you do not have them, ask someone to add them for you. What else can I do to help prevent falls?  Wear shoes that:  Do not have high heels.  Have rubber bottoms.  Are comfortable and fit you well.  Are closed at the toe. Do not wear sandals.  If you use a stepladder:  Make sure that it is fully opened. Do not climb a closed stepladder.  Make sure that both sides of the stepladder are locked into place.  Ask someone to hold it for you, if possible.  Clearly mark and make sure that you can see:  Any grab bars or handrails.  First and last steps.  Where the edge of each step is.  Use tools that help you move around (mobility aids) if they are needed. These include:  Canes.  Walkers.  Scooters.  Crutches.  Turn on the lights when you go into a dark area. Replace any light bulbs as soon as they burn out.  Set up your furniture so you have a clear path. Avoid moving your furniture around.  If any of your floors are uneven, fix them.  If there are any pets around you, be aware of where they are.  Review your medicines with your doctor. Some medicines can make you feel dizzy. This can increase your chance of falling. Ask your doctor what other things that you can do to help prevent falls. This information is not intended to replace advice given to you by your health care provider. Make sure you discuss any questions you have with your health care provider. Document Released: 03/25/2009 Document Revised:  11/04/2015 Document Reviewed: 07/03/2014 Elsevier Interactive Patient Education  2017 [employer].

## 2020-07-07 NOTE — Progress Notes (Addendum)
Subjective:   Terri Andrade is a 70 y.o. female who presents for an Initial Medicare Annual Wellness Visit.  Review of Systems    No ROS.  Medicare Wellness Virtual Visit.   Cardiac Risk Factors include: advanced age (>49men, >61 women)     Objective:    Today's Vitals   07/07/20 1239  Weight: 160 lb (72.6 kg)  Height: 5\' 3"  (1.6 m)   Body mass index is 28.34 kg/m.  Advanced Directives 07/07/2020  Does Patient Have a Medical Advance Directive? Yes  Type of Paramedic of [place];Living will  Does patient want to make changes to medical advance directive? No - Patient declined  Copy of [place] in Chart? No - copy requested    Current Medications (verified) Outpatient Encounter Medications as of 07/07/2020  Medication Sig   albuterol (PROAIR HFA) 108 (90 Base) MCG/ACT inhaler Inhale 1-2 puffs into the lungs every 6 (six) hours as needed for wheezing or shortness of breath.   omeprazole (PRILOSEC) 40 MG capsule TAKE 1 CAPSULE BY MOUTH EVERY DAY   No facility-administered encounter medications on file as of 07/07/2020.    Allergies (verified) Patient has no known allergies.   History: Past Medical History:  Diagnosis Date   Lump or mass in breast 2014   Treadmill stress test negative for angina pectoris 2009   Past Surgical History:  Procedure Laterality Date   BREAST BIOPSY Right 12-25-2012    BENIGN BREAST TISSUE WITH FOCAL COLUMNAR CELL CHANGE, USUAL   CHOLECYSTECTOMY  2004   SPINE SURGERY  2007   Lumbar disk,  [name]   TUBAL LIGATION     Family History  Problem Relation Age of Onset   Mental illness Mother        bipolar disorder   Cancer Father 52       tobacco related CA   Cancer Brother        tobacco related CA   Birth defects Neg Hx    Breast cancer Neg Hx    Social History   Socioeconomic History   Marital status: Married    Spouse name: Not on file   Number of children: Not on file   Years of  education: Not on file   Highest education level: Not on file  Occupational History   Not on file  Tobacco Use   Smoking status: Never Smoker   Smokeless tobacco: Never Used  Substance and Sexual Activity   Alcohol use: Yes    Comment: social   Drug use: No   Sexual activity: Not on file  Other Topics Concern   Not on file  Social History Narrative   Not on file   Social Determinants of Health   Financial Resource Strain: Low Risk    Difficulty of Paying Living Expenses: Not hard at all  Food Insecurity: No Food Insecurity   Worried About Charity fundraiser in the Last Year: Never true   [place] in the Last Year: Never true  Transportation Needs: No Transportation Needs   Lack of Transportation (Medical): No   Lack of Transportation (Non-Medical): No  Physical Activity: Unknown   Days of Exercise per Week: 0 days   Minutes of Exercise per Session: Not on file  Stress: No Stress Concern Present   Feeling of Stress : Not at all  Social Connections: Unknown   Frequency of Communication with Friends and Family: More than three times a  week   Frequency of Social Gatherings with Friends and Family: More than three times a week   Attends Religious Services: Not on Electrical engineer or Organizations: Not on file   Attends Archivist Meetings: Not on file   Marital Status: Married    Tobacco Counseling Counseling given: Not Answered   Clinical Intake:  Pre-visit preparation completed: Yes        Diabetes: No  How often do you need to have someone help you when you read instructions, pamphlets, or other written materials from your doctor or pharmacy?: 1 - Never    Interpreter Needed?: No      Activities of Daily Living In your present state of health, do you have any difficulty performing the following activities: 07/07/2020  Hearing? N  Vision? N  Difficulty concentrating or making decisions? N  Walking or climbing stairs? N   Dressing or bathing? N  Doing errands, shopping? N  Preparing Food and eating ? N  Using the Toilet? N  In the past six months, have you accidently leaked urine? N  Do you have problems with loss of bowel control? N  Managing your Medications? N  Managing your Finances? N  Housekeeping or managing your Housekeeping? N  Some recent data might be hidden    Patient Care Team: Crecencio Mc, MD as PCP - General (Internal Medicine) Bary Castilla Forest Gleason, MD as Consulting Physician (General Surgery) Crecencio Mc, MD (Internal Medicine)  Indicate any recent Medical Services you may have received from other than [facility] providers in the past year (date may be approximate).     Assessment:   This is a routine wellness examination for Terri Andrade.  I connected with Terri Andrade today by telephone and verified that I am speaking with the correct person using two identifiers. Location patient: home Location provider: work Persons participating in the virtual visit: patient, Marine scientist.    I discussed the limitations, risks, security and privacy concerns of performing an evaluation and management service by telephone and the availability of in person appointments. The patient expressed understanding and verbally consented to this telephonic visit.    Interactive audio and video telecommunications were attempted between this provider and patient, however failed, due to patient having technical difficulties OR patient did not have access to video capability.  We continued and completed visit with audio only.  Some vital signs may be absent or patient reported.   Hearing/Vision screen  Hearing Screening   125Hz  250Hz  500Hz  1000Hz  2000Hz  3000Hz  4000Hz  6000Hz  8000Hz   Right ear:           Left ear:           Comments: Patient is able to hear conversational tones without difficulty.  No issues reported.  Vision Screening Comments: Wears corrective lenses Visual acuity not assessed, virtual visit.        Dietary issues and exercise activities discussed: Current Exercise Habits: The patient does not participate in regular exercise at present  Healthy diet  Good water intake  Goals      Increase physical activity     Walk for exercise       Depression Screen PHQ 2/9 Scores 07/07/2020 02/03/2019 12/04/2016 03/04/2013  PHQ - 2 Score 0 0 0 0  PHQ- 9 Score - 0 0 -    Fall Risk Fall Risk  07/07/2020 03/24/2019 02/03/2019 03/04/2013  Falls in the past year? 0 0 0 No  Number falls in past yr:  0 - - -  Injury with Fall? 0 - - -  Follow up Falls evaluation completed Falls evaluation completed - -    FALL RISK PREVENTION PERTAINING TO THE HOME: Handrails in use when climbing stairs? Yes Home free of loose throw rugs in walkways, pet beds, electrical cords, etc? Yes  Adequate lighting in your home to reduce risk of falls? Yes   ASSISTIVE DEVICES UTILIZED TO PREVENT FALLS: Use of a cane, walker or w/c? No   TIMED UP AND GO: Was the test performed? No . Virtual visit.   Cognitive Function:  Patient is alert and oriented x3.  Denies difficulty focusing, making decisions, memory loss.  Enjoys cooking and reading.  MMSE/6CIT deferred. Normal by direct communication/observation.     Immunizations Immunization History  Administered Date(s) Administered   Fluad Quad(high Dose 65+) 02/20/2019   Influenza Split 04/01/2014, 03/20/2018   Influenza,inj,Quad PF,6+ Mos 03/27/2017   Influenza-Unspecified 02/27/2013, 03/10/2015   PFIZER(Purple Top)SARS-COV-2 Vaccination 08/14/2019, 09/04/2019   Pneumococcal Conjugate-13 05/13/2015   Pneumococcal Polysaccharide-23 04/10/2014, 03/24/2019   Tdap 03/04/2013   Zoster 04/07/2012    Health Maintenance Health Maintenance  Topic Date Due   DEXA SCAN  Never done   COVID-19 Vaccine (3 - Pfizer risk 4-dose series) 07/23/2020 (Originally 10/02/2019)   MAMMOGRAM  05/17/2022   Fecal DNA (Cologuard)  06/29/2022   TETANUS/TDAP  03/05/2023    INFLUENZA VACCINE  Completed   Hepatitis C Screening  Completed   PNA vac Low Risk Adult  Completed   Colorectal cancer screening: Type of screening: Cologuard. Completed 06/30/19. Repeat every 3 years  Mammogram status: Completed 05/17/20. Repeat every year.MM 3D SCREEN BREAST BILATERAL.  Dexa Scan- ordered per consent.   Lung Cancer Screening: (Low Dose CT Chest recommended if Age 71-80 years, 30 pack-year currently smoking OR have quit w/in 15years.) does not qualify.   Hepatitis C Screening: Completed 05/13/15.  Vision Screening: Recommended annual ophthalmology exams for early detection of glaucoma and other disorders of the eye. Is the patient up to date with their annual eye exam?  Yes   Dental Screening: Recommended annual dental exams for proper oral hygiene.  Community Resource Referral / Chronic Care Management: CRR required this visit?  No   CCM required this visit?  No      Plan:   Keep all routine maintenance appointments.   Follow up 07/20/20 @ 3:00  I have personally reviewed and noted the following in the patient's chart:   Medical and social history Use of alcohol, tobacco or illicit drugs  Current medications and supplements Functional ability and status Nutritional status Physical activity Advanced directives List of other physicians Hospitalizations, surgeries, and ER visits in previous 12 months Vitals Screenings to include cognitive, depression, and falls Referrals and appointments  In addition, I have reviewed and discussed with patient certain preventive protocols, quality metrics, and best practice recommendations. A written personalized care plan for preventive services as well as general preventive health recommendations were provided to patient via mychart.     OBrien-Blaney, Chelly Dombeck L, LPN   5/00/9381    I have reviewed the above information and agree with above.   Deborra Medina, MD

## 2020-07-20 ENCOUNTER — Other Ambulatory Visit: Payer: Self-pay

## 2020-07-20 ENCOUNTER — Ambulatory Visit (INDEPENDENT_AMBULATORY_CARE_PROVIDER_SITE_OTHER): Payer: Medicare Other | Admitting: Internal Medicine

## 2020-07-20 ENCOUNTER — Encounter: Payer: Self-pay | Admitting: Internal Medicine

## 2020-07-20 ENCOUNTER — Ambulatory Visit (INDEPENDENT_AMBULATORY_CARE_PROVIDER_SITE_OTHER): Payer: Medicare Other

## 2020-07-20 VITALS — BP 122/78 | HR 98 | Temp 98.7°F | Ht 62.99 in | Wt 162.4 lb

## 2020-07-20 DIAGNOSIS — N904 Leukoplakia of vulva: Secondary | ICD-10-CM

## 2020-07-20 DIAGNOSIS — R058 Other specified cough: Secondary | ICD-10-CM

## 2020-07-20 DIAGNOSIS — R635 Abnormal weight gain: Secondary | ICD-10-CM

## 2020-07-20 DIAGNOSIS — E785 Hyperlipidemia, unspecified: Secondary | ICD-10-CM

## 2020-07-20 DIAGNOSIS — R5383 Other fatigue: Secondary | ICD-10-CM

## 2020-07-20 DIAGNOSIS — R7401 Elevation of levels of liver transaminase levels: Secondary | ICD-10-CM

## 2020-07-20 DIAGNOSIS — Z1211 Encounter for screening for malignant neoplasm of colon: Secondary | ICD-10-CM

## 2020-07-20 DIAGNOSIS — N951 Menopausal and female climacteric states: Secondary | ICD-10-CM

## 2020-07-20 MED ORDER — ESCITALOPRAM OXALATE 5 MG PO TABS: 5.0000 mg | ORAL_TABLET | Freq: Every day | ORAL | 5 refills | Status: DC

## 2020-07-20 MED ORDER — BENZONATATE 200 MG PO CAPS: 200.0000 mg | ORAL_CAPSULE | Freq: Three times a day (TID) | ORAL | 1 refills | Status: DC | PRN

## 2020-07-20 NOTE — Progress Notes (Signed)
Patient ID: Terri Andrade, female    DOB: 1950/10/09  Age: 70 y.o. MRN: [id number]  The patient is here for follow up and management of several  chronic and acute problems.   The risk factors are reflected in the social history.  The roster of all physicians providing medical care to patient - is listed in the Snapshot section of the chart.  Activities of daily living:  The patient is 100% independent in all ADLs: dressing, toileting, feeding as well as independent mobility  Home safety : The patient has smoke detectors in the home. They wear seatbelts.  There are no firearms at home. There is no violence in the home.   There is no risks for hepatitis, STDs or HIV. There is no   history of blood transfusion. They have no travel history to infectious disease endemic areas of the world.  The patient has seen their dentist in the last six month. They have seen their eye doctor in the last year. They admit to slight hearing difficulty with regard to whispered voices and some television programs.  They have deferred audiologic testing in the last year.  They do not  have excessive sun exposure. Discussed the need for sun protection: hats, long sleeves and use of sunscreen if there is significant sun exposure.   Diet: the importance of a healthy diet is discussed. They do have a healthy diet.  The benefits of regular aerobic exercise were discussed. She walks 4 times per week ,  20 minutes.   Depression screen: there are no signs or vegetative symptoms of depression- irritability, change in appetite, anhedonia, sadness/tearfullness.  Cognitive assessment: the patient manages all their financial and personal affairs and is actively engaged. They could relate day,date,year and events; recalled 2/3 objects at 3 minutes; performed clock-face test normally.  The following portions of the patient's history were reviewed and updated as appropriate: allergies, current medications, past family history, past  medical history,  past surgical history, past social history  and problem list.  Visual acuity was not assessed per patient preference since she has regular follow up with her ophthalmologist. Hearing and body mass index were assessed and reviewed.   During the course of the visit the patient was educated and counseled about appropriate screening and preventive services including : fall prevention , diabetes screening, nutrition counseling, colorectal cancer screening, and recommended immunizations.    CC: The primary encounter diagnosis was Menopausal hot flushes. Diagnoses of Lichen sclerosus of vulva, Hyperlipidemia LDL goal <160, Cough present for greater than 3 weeks, Fatigue, unspecified type, Elevated transaminase level, and Weight gain were also pertinent to this visit.   1) persistent cough for the past 4 weeks.  Husband Louie Casa had COVID infection 4  Weeks ago,  Her Home test was negative x 2.  She has been using albuterol without change. Her cough is triggered by cold temperatures and damp  Weather.  2) hot flashes: most bothersome at night    History [name] has a past medical history of Lump or mass in breast (2014) and Treadmill stress test negative for angina pectoris (2009).   She has a past surgical history that includes Spine surgery (2007); Cholecystectomy (2004); Tubal ligation; and Breast biopsy (Right, 12-25-2012).   Her family history includes Cancer in her brother; Cancer (age of onset: 27) in her father; Mental illness in her mother.She reports that she has never smoked. She has never used smokeless tobacco. She reports current alcohol use. She reports that she  does not use drugs.  Outpatient Medications Prior to Visit  Medication Sig Dispense Refill  . albuterol (PROAIR HFA) 108 (90 Base) MCG/ACT inhaler Inhale 1-2 puffs into the lungs every 6 (six) hours as needed for wheezing or shortness of breath. 8 g 2  . aspirin 81 MG EC tablet Take by mouth.    . Multiple Vitamin  (MULTI-VITAMIN) tablet Take 1 tablet by mouth daily.    Marland Kitchen omeprazole (PRILOSEC) 40 MG capsule TAKE 1 CAPSULE BY MOUTH EVERY DAY 90 capsule 2  . triamcinolone ointment (KENALOG) 0.1 % Apply 1 application topically 2 (two) times daily.     No facility-administered medications prior to visit.    Review of Systems   Patient denies headache, fevers, malaise, unintentional weight loss, skin rash, eye pain, sinus congestion and sinus pain, sore throat, dysphagia,  hemoptysis , cough, dyspnea, wheezing, chest pain, palpitations, orthopnea, edema, abdominal pain, nausea, melena, diarrhea, constipation, flank pain, dysuria, hematuria, urinary  Frequency, nocturia, numbness, tingling, seizures,  Focal weakness, Loss of consciousness,  Tremor, insomnia, depression, anxiety, and suicidal ideation.     Objective:  BP 122/78 (BP Location: Left Arm, Patient Position: Sitting)   Pulse 98   Temp 98.7 F (37.1 C)   Ht 5' 2.99" (1.6 m)   Wt 162 lb 6.4 oz (73.7 kg)   SpO2 99%   BMI 28.78 kg/m   Physical Exam  General appearance: alert, cooperative and appears stated age Ears: normal TM's and external ear canals both ears Throat: lips, mucosa, and tongue normal; teeth and gums normal Neck: no adenopathy, no carotid bruit, supple, symmetrical, trachea midline and thyroid not enlarged, symmetric, no tenderness/mass/nodules Back: symmetric, no curvature. ROM normal. No CVA tenderness. Lungs: clear to auscultation bilaterally Heart: regular rate and rhythm, S1, S2 normal, no murmur, click, rub or gallop Abdomen: soft, non-tender; bowel sounds normal; no masses,  no organomegaly Pulses: 2+ and symmetric Skin: Skin color, texture, turgor normal. No rashes or lesions Lymph nodes: Cervical, supraclavicular, and axillary nodes normal.  Assessment & Plan:   Problem List Items Addressed This Visit      Unprioritized   Cough present for greater than 3 weeks    No wheezing on exam.  CBC normal Chest x ray  notes chronic bronchitic changes slightly more prominent than previously, without infiltrates or hyperinflation. Probably post COVID, given exposure to husband's illness 4 weeks ago.  Tessalon perles prescribed for cough.  She declines prednisone taper      Relevant Orders   DG Chest 2 View (Completed)   SARS-CoV-2 Semi-Quantitative Total Antibody, Spike   Elevated transaminase level    New onset,  Will repeat in 2 weeks      Hyperlipidemia LDL goal <160    10 yr risk for CAD using FRC is 8%.  No statin therapy recommended .  Lab Results  Component Value Date   CHOL 252 (H) 07/20/2020   HDL 55.80 07/20/2020   LDLCALC 147 (H) 12/04/2016   LDLDIRECT 166.0 07/20/2020   TRIG 319.0 (H) 07/20/2020   CHOLHDL 5 07/20/2020         Relevant Medications   aspirin 81 MG EC tablet   Other Relevant Orders   Lipid panel (Completed)   Comprehensive metabolic panel (Completed)   Lichen sclerosus of vulva    Biopsy proved ([facility] 2021) managed with triamcinolone and desitin prn      Menopausal hot flushes - Primary    Encouraged to start exercising regularly and trial of lexapro  5 mg daily      Relevant Medications   aspirin 81 MG EC tablet   Weight gain    I have addressed  BMI and recommended a low glycemic index diet utilizing smaller more frequent meals to increase metabolism.  I have also recommended that patient start exercising with a goal of 30 minutes of aerobic exercise a minimum of 5 days per week.         Other Visit Diagnoses    Fatigue, unspecified type       Relevant Orders   CBC with Differential/Platelet (Completed)   TSH (Completed)      I am having Bridgette Habermann start on benzonatate and escitalopram. I am also having her maintain her omeprazole, albuterol, Multi-Vitamin, triamcinolone ointment, and aspirin.  Meds ordered this encounter  Medications  . benzonatate (TESSALON) 200 MG capsule    Sig: Take 1 capsule (200 mg total) by mouth 3 (three) times daily  as needed for cough.    Dispense:  60 capsule    Refill:  1  . escitalopram (LEXAPRO) 5 MG tablet    Sig: Take 1 tablet (5 mg total) by mouth daily.    Dispense:  30 tablet    Refill:  5    There are no discontinued medications.  Follow-up: Return in about 1 year (around 07/20/2021).   Crecencio Mc, MD

## 2020-07-20 NOTE — Patient Instructions (Addendum)
I am prescribing tessalon capsules for your cough  You can take this every 8 hours . If not strong enough,  let me know   Trial of lexapro 5 mg daily for hot flashes.  Take it once daily with a meal.    Start some form of exercise program . It will help with the  Hot flashes !   WE WILL REPEAT THE COLOGUARD IN 2024

## 2020-07-20 NOTE — Assessment & Plan Note (Signed)
Biopsy proved ([facility] 2021) managed with triamcinolone and desitin prn

## 2020-07-21 DIAGNOSIS — R7401 Elevation of levels of liver transaminase levels: Secondary | ICD-10-CM | POA: Insufficient documentation

## 2020-07-21 DIAGNOSIS — R058 Other specified cough: Secondary | ICD-10-CM | POA: Insufficient documentation

## 2020-07-21 LAB — COMPREHENSIVE METABOLIC PANEL
ALT: 52 U/L — ABNORMAL HIGH (ref 0–35)
AST: 47 U/L — ABNORMAL HIGH (ref 0–37)
Albumin: 4.4 g/dL (ref 3.5–5.2)
Alkaline Phosphatase: 81 U/L (ref 39–117)
BUN: 22 mg/dL (ref 6–23)
CO2: 29 mEq/L (ref 19–32)
Calcium: 10.1 mg/dL (ref 8.4–10.5)
Chloride: 98 mEq/L (ref 96–112)
Creatinine, Ser: 0.98 mg/dL (ref 0.40–1.20)
GFR: 59.48 mL/min — ABNORMAL LOW (ref >60.00)
Glucose, Bld: 82 mg/dL (ref 70–99)
Potassium: 4.3 mEq/L (ref 3.5–5.1)
Sodium: 137 mEq/L (ref 135–145)
Total Bilirubin: 0.3 mg/dL (ref 0.2–1.2)
Total Protein: 8 g/dL (ref 6.0–8.3)

## 2020-07-21 LAB — CBC WITH DIFFERENTIAL/PLATELET
Basophils Absolute: 0.1 10*3/uL (ref 0.0–0.1)
Basophils Relative: 1.2 % (ref 0.0–3.0)
Eosinophils Absolute: 0.2 10*3/uL (ref 0.0–0.7)
Eosinophils Relative: 2.6 % (ref 0.0–5.0)
HCT: 42.5 % (ref 36.0–46.0)
Hemoglobin: 14.8 g/dL (ref 12.0–15.0)
Lymphocytes Relative: 30.2 % (ref 12.0–46.0)
Lymphs Abs: 2.6 10*3/uL (ref 0.7–4.0)
MCHC: 34.9 g/dL (ref 30.0–36.0)
MCV: 95.9 fl (ref 78.0–100.0)
Monocytes Absolute: 0.7 10*3/uL (ref 0.1–1.0)
Monocytes Relative: 8.5 % (ref 3.0–12.0)
Neutro Abs: 4.9 10*3/uL (ref 1.4–7.7)
Neutrophils Relative %: 57.5 % (ref 43.0–77.0)
Platelets: 400 10*3/uL (ref 150.0–400.0)
RBC: 4.43 Mil/uL (ref 3.87–5.11)
RDW: 12.2 % (ref 11.5–15.5)
WBC: 8.5 10*3/uL (ref 4.0–10.5)

## 2020-07-21 LAB — LIPID PANEL
Cholesterol: 252 mg/dL — ABNORMAL HIGH (ref 0–200)
HDL: 55.8 mg/dL (ref >39.00)
NonHDL: 195.79
Total CHOL/HDL Ratio: 5
Triglycerides: 319 mg/dL — ABNORMAL HIGH (ref 0.0–149.0)
VLDL: 63.8 mg/dL — ABNORMAL HIGH (ref 0.0–40.0)

## 2020-07-21 LAB — TSH: TSH: 1.16 u[IU]/mL (ref 0.35–4.50)

## 2020-07-21 LAB — LDL CHOLESTEROL, DIRECT: Direct LDL: 166 mg/dL

## 2020-07-21 NOTE — Assessment & Plan Note (Addendum)
No wheezing on exam.  CBC normal Chest x ray notes chronic bronchitic changes slightly more prominent than previously, without infiltrates or hyperinflation. Probably post COVID, given exposure to husband's illness 4 weeks ago.  Tessalon perles prescribed for cough.  She declines prednisone taper

## 2020-07-21 NOTE — Assessment & Plan Note (Signed)
New onset,  Will repeat in 2 weeks

## 2020-07-21 NOTE — Assessment & Plan Note (Signed)
10 yr risk for CAD using FRC is 8%.  No statin therapy recommended .  Lab Results  Component Value Date   CHOL 252 (H) 07/20/2020   HDL 55.80 07/20/2020   LDLCALC 147 (H) 12/04/2016   LDLDIRECT 166.0 07/20/2020   TRIG 319.0 (H) 07/20/2020   CHOLHDL 5 07/20/2020

## 2020-07-21 NOTE — Assessment & Plan Note (Signed)
I have addressed  BMI and recommended a low glycemic index diet utilizing smaller more frequent meals to increase metabolism.  I have also recommended that patient start exercising with a goal of 30 minutes of aerobic exercise a minimum of 5 days per week.  

## 2020-07-21 NOTE — Assessment & Plan Note (Signed)
Encouraged to start exercising regularly and trial of lexapro 5 mg daily

## 2020-07-22 ENCOUNTER — Telehealth: Payer: Self-pay

## 2020-07-22 MED ORDER — CHERATUSSIN AC 100-10 MG/5ML PO SOLN: 5.0000 mL | Freq: Three times a day (TID) | ORAL | 0 refills | Status: DC | PRN

## 2020-07-22 NOTE — Telephone Encounter (Signed)
Left a message to call back for lab results and schedule for repeat labs.

## 2020-07-22 NOTE — Telephone Encounter (Signed)
Pt needs this medication called into [facility] [place] [facility]

## 2020-07-22 NOTE — Addendum Note (Signed)
Addended by: Crecencio Mc on: 07/22/2020 02:00 PM   Modules accepted: Orders

## 2020-07-22 NOTE — Telephone Encounter (Signed)
Terri Andrade would like a different prescription than Terri Andrade as her insurance will not cover it.

## 2020-07-24 LAB — SARS-COV-2 SEMI-QUANTITATIVE TOTAL ANTIBODY, SPIKE: SARS COV2 AB, Total Spike Semi QN: >2500 U/mL — ABNORMAL HIGH (ref <0.8)

## 2020-07-25 MED ORDER — CHERATUSSIN AC 100-10 MG/5ML PO SOLN: 5.0000 mL | Freq: Three times a day (TID) | ORAL | 0 refills | Status: DC | PRN

## 2020-07-25 NOTE — Addendum Note (Signed)
Addended by: Crecencio Mc on: 07/25/2020 08:56 PM   Modules accepted: Orders

## 2020-08-05 ENCOUNTER — Other Ambulatory Visit (INDEPENDENT_AMBULATORY_CARE_PROVIDER_SITE_OTHER): Payer: Medicare Other

## 2020-08-05 ENCOUNTER — Other Ambulatory Visit: Payer: Self-pay

## 2020-08-05 DIAGNOSIS — R7401 Elevation of levels of liver transaminase levels: Secondary | ICD-10-CM

## 2020-08-05 LAB — COMPREHENSIVE METABOLIC PANEL
ALT: 26 U/L (ref 0–35)
AST: 27 U/L (ref 0–37)
Albumin: 4 g/dL (ref 3.5–5.2)
Alkaline Phosphatase: 68 U/L (ref 39–117)
BUN: 19 mg/dL (ref 6–23)
CO2: 32 mEq/L (ref 19–32)
Calcium: 9.5 mg/dL (ref 8.4–10.5)
Chloride: 99 mEq/L (ref 96–112)
Creatinine, Ser: 0.76 mg/dL (ref 0.40–1.20)
GFR: 80.68 mL/min (ref >60.00)
Glucose, Bld: 84 mg/dL (ref 70–99)
Potassium: 4.1 mEq/L (ref 3.5–5.1)
Sodium: 138 mEq/L (ref 135–145)
Total Bilirubin: 0.4 mg/dL (ref 0.2–1.2)
Total Protein: 7.1 g/dL (ref 6.0–8.3)

## 2020-08-12 ENCOUNTER — Other Ambulatory Visit: Payer: Self-pay | Admitting: Internal Medicine

## 2020-09-27 ENCOUNTER — Other Ambulatory Visit: Payer: Self-pay | Admitting: Internal Medicine

## 2021-01-05 ENCOUNTER — Other Ambulatory Visit: Payer: Self-pay | Admitting: Internal Medicine

## 2021-01-11 ENCOUNTER — Other Ambulatory Visit: Payer: Self-pay

## 2021-01-11 NOTE — Telephone Encounter (Signed)
Unable to delete this duplicate request.

## 2021-01-11 NOTE — Telephone Encounter (Signed)
Controlled substance cough syrup  requires OV to refll

## 2021-01-11 NOTE — Telephone Encounter (Signed)
LMTCB

## 2021-01-14 ENCOUNTER — Ambulatory Visit
Admission: RE | Admit: 2021-01-14 | Discharge: 2021-01-14 | Disposition: A | Discharge status: Home / Self Care | Payer: Medicare Other | Source: Ambulatory Visit | Attending: Emergency Medicine | Admitting: Emergency Medicine

## 2021-01-14 ENCOUNTER — Other Ambulatory Visit: Payer: Self-pay

## 2021-01-14 ENCOUNTER — Ambulatory Visit (INDEPENDENT_AMBULATORY_CARE_PROVIDER_SITE_OTHER): Payer: Medicare Other

## 2021-01-14 VITALS — BP 113/78 | HR 88 | Temp 97.9°F | Resp 23

## 2021-01-14 DIAGNOSIS — R0602 Shortness of breath: Secondary | ICD-10-CM

## 2021-01-14 DIAGNOSIS — R062 Wheezing: Secondary | ICD-10-CM

## 2021-01-14 DIAGNOSIS — R059 Cough, unspecified: Secondary | ICD-10-CM

## 2021-01-14 DIAGNOSIS — J189 Pneumonia, unspecified organism: Secondary | ICD-10-CM | POA: Diagnosis not present

## 2021-01-14 HISTORY — DX: Unspecified chronic bronchitis: J42

## 2021-01-14 MED ORDER — ALBUTEROL SULFATE HFA 108 (90 BASE) MCG/ACT IN AERS: 1.0000 | INHALATION_SPRAY | Freq: Four times a day (QID) | RESPIRATORY_TRACT | 0 refills | Status: AC | PRN

## 2021-01-14 MED ORDER — METHYLPREDNISOLONE 4 MG PO TBPK: ORAL_TABLET | ORAL | 0 refills | Status: DC

## 2021-01-14 MED ORDER — AZITHROMYCIN 250 MG PO TABS: ORAL_TABLET | ORAL | 0 refills | Status: AC

## 2021-01-14 MED ORDER — AMOXICILLIN-POT CLAVULANATE 875-125 MG PO TABS: 1.0000 | ORAL_TABLET | Freq: Two times a day (BID) | ORAL | 0 refills | Status: DC

## 2021-01-14 NOTE — ED Provider Notes (Signed)
CHIEF COMPLAINT:   Chief Complaint  Patient presents with   Cough   Shortness of Breath   APPT 0945     SUBJECTIVE/HPI:   Cough Associated symptoms: shortness of breath   Shortness of Breath Associated symptoms: cough   A very pleasant 70 y.o.Female presents today with cough and shortness of breath for the last 8 days.  Patient also endorses wheezing.  Patient states that her shortness of breath is worse when she is talking or ambulating.  She has taken 2 at home COVID-19 test which were both negative.  Patient reports a history of bronchitis and denies any history of tobacco use.  Patient also denies any history of asthma, but states that she does have a Ventolin inhaler at home for which she has not used.  Patient does not report any history of recurrent pneumonia or known sick contacts. Patient does not report any chest pain, palpitations, visual changes, weakness, tingling, headache, nausea, vomiting, diarrhea, fever, chills.   has a past medical history of Chronic bronchitis ([place]), Lump or mass in breast (06/12/2012), and Treadmill stress test negative for angina pectoris (06/13/2007).  ROS:  Review of Systems  Respiratory:  Positive for cough and shortness of breath.   See Subjective/HPI Medications, Allergies and Problem List personally reviewed in Epic today OBJECTIVE:   Vitals:   01/14/21 1006  BP: 113/78  Pulse: 88  Resp: (!) 23  Temp: 97.9 F (36.6 C)  SpO2: 93%    Physical Exam   General: Appears well-developed and well-nourished. No acute distress.  HEENT Head: Normocephalic and atraumatic.   Ears: Hearing grossly intact, no drainage or visible deformity.  Eyes: Conjunctivae and EOM are normal. No eye drainage or scleral icterus bilaterally.  Neck: Normal range of motion, neck is supple.  Cardiovascular: Normal rate. Regular rhythm; no murmurs, gallops, or rubs.  Pulm/Chest: No respiratory distress.  Patient does exhibit a mild shortened breathing pattern.   The patient does have expiratory wheezing noted over all lung fields which somewhat clears with cough. Neurological: Alert and oriented to person, place, and time.  Skin: Skin is warm and dry.  No rashes, lesions, abrasions or bruising noted to skin.   Psychiatric: Normal mood, affect, behavior, and thought content.   Vital signs and nursing note reviewed.   Patient stable and cooperative with examination. PROCEDURES:    LABS/X-RAYS/EKG/MEDS:   No results found for any visits on 01/14/21.  MEDICAL DECISION MAKING:   Patient presents with cough and shortness of breath for the last 8 days.  Patient also endorses wheezing.  Patient states that her shortness of breath is worse when she is talking or ambulating.  She has taken 2 at home COVID-19 test which were both negative.  Patient reports a history of bronchitis and denies any history of tobacco use.  Patient also denies any history of asthma, but states that she does have a Ventolin inhaler at home for which she has not used.  Patient does not report any history of recurrent pneumonia or known sick contacts. Patient does not report any chest pain, palpitations, visual changes, weakness, tingling, headache, nausea, vomiting, diarrhea, fever, chills.  Chart review completed.  CXR reveals left lower lobe opacity.  As read by me, overread pending.  Will treat for community-acquired pneumonia to the left lower lobe with Augmentin, azithromycin, Medrol Dosepak, and Ventolin inhaler.  Advised the patient if her symptoms do not significantly improve over the next 1 to 2 days or begin to worsen she  needs to present to the emergency department for further evaluation.  The patient is not acute in clinic today, as such I do not feel that she needs to present to the emergency department immediately, but advised that if her patient does not improve she will need to go to the emergency department.  Return as needed.  Patient verbalized understanding and agreed  with treatment plan.  Patient stable upon discharge. ASSESSMENT/PLAN:  1. Pneumonia of left lower lobe due to infectious organism - amoxicillin-clavulanate (AUGMENTIN) 875-125 MG tablet; Take 1 tablet by mouth every 12 (twelve) hours.  Dispense: 14 tablet; Refill: 0 - albuterol (VENTOLIN HFA) 108 (90 Base) MCG/ACT inhaler; Inhale 1-2 puffs into the lungs every 6 (six) hours as needed (cough).  Dispense: 6.7 g; Refill: 0 - methylPREDNISolone (MEDROL DOSEPAK) 4 MG TBPK tablet; Use dosepak as directed  Dispense: 21 tablet; Refill: 0 - azithromycin (ZITHROMAX Z-PAK) 250 MG tablet; Take 2 tablets (500 mg total) by mouth daily for 1 day, THEN 1 tablet (250 mg total) daily for 4 days.  Dispense: 6 tablet; Refill: 0  2. Mild shortness of breath  3. Cough  4. Wheezing Instructions about new medications and side effects provided.  Plan:   Discharge Instructions      If you begin to experience any worsening of symptoms or do not improve over the next 1 to 2 days you need to report to the emergency department for further investigation.  Follow-up with PCP within 1 week for recheck   Take all medications as prescribed.  Rest, push lots of fluids (especially water), and utilize supportive care for symptoms. You may take take acetaminophen (Tylenol) every 4-6 hours or ibuprofen every 6-8 hours for muscle pain, joint pain, headaches. Mucinex (guaifenesin) may be taken over the counter for cough as needed and can loosen phlegm. Please read the instructions and take as directed. Saline nasal sprays to rinse congestion can help as well. Warm tea with lemon and honey can sooth sore throat and cough, as can cough drops.  Return to clinic for new-onset fever, difficulty breathing, chest pain, symptoms lasting >3 to 4 weeks, or bloody sputum.          Serafina Royals, [place] 01/14/21 1124

## 2021-01-14 NOTE — Discharge Instructions (Addendum)
If you begin to experience any worsening of symptoms or do not improve over the next 1 to 2 days you need to report to the emergency department for further investigation.  Follow-up with PCP within 1 week for recheck   Take all medications as prescribed.  Rest, push lots of fluids (especially water), and utilize supportive care for symptoms. You may take take acetaminophen (Tylenol) every 4-6 hours or ibuprofen every 6-8 hours for muscle pain, joint pain, headaches. Mucinex (guaifenesin) may be taken over the counter for cough as needed and can loosen phlegm. Please read the instructions and take as directed. Saline nasal sprays to rinse congestion can help as well. Warm tea with lemon and honey can sooth sore throat and cough, as can cough drops.  Return to clinic for new-onset fever, difficulty breathing, chest pain, symptoms lasting >3 to 4 weeks, or bloody sputum.

## 2021-01-14 NOTE — ED Triage Notes (Signed)
Patient c/o SOB and productive cough w/ unknown sputum characteristics x 8 days.   Patient denies fever at home.   Patient endorses chest congestion.   Patient endorses increasing SOB when talking and ambulating.   Patient took 2 at home COVID test with negative results.   History of Chronic Bronchitis.   Patient denies Tobacco Use History.

## 2021-02-04 DIAGNOSIS — R058 Other specified cough: Secondary | ICD-10-CM

## 2021-03-04 IMAGING — MG DIGITAL SCREENING BILAT W/ TOMO W/ CAD
8 series · 8 of 24 positions shown · non-contrast
Comparison: Previous exam(s).

CLINICAL DATA: Screening.

EXAM:
DIGITAL SCREENING BILATERAL MAMMOGRAM WITH TOMO AND CAD

[R CC synth-2D]
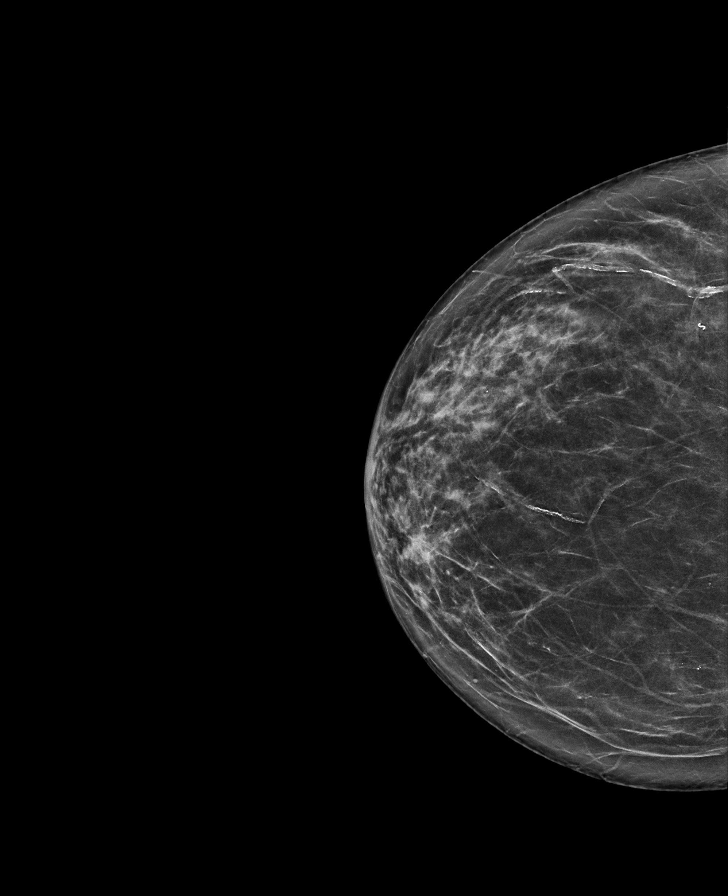

[L MLO synth-2D]
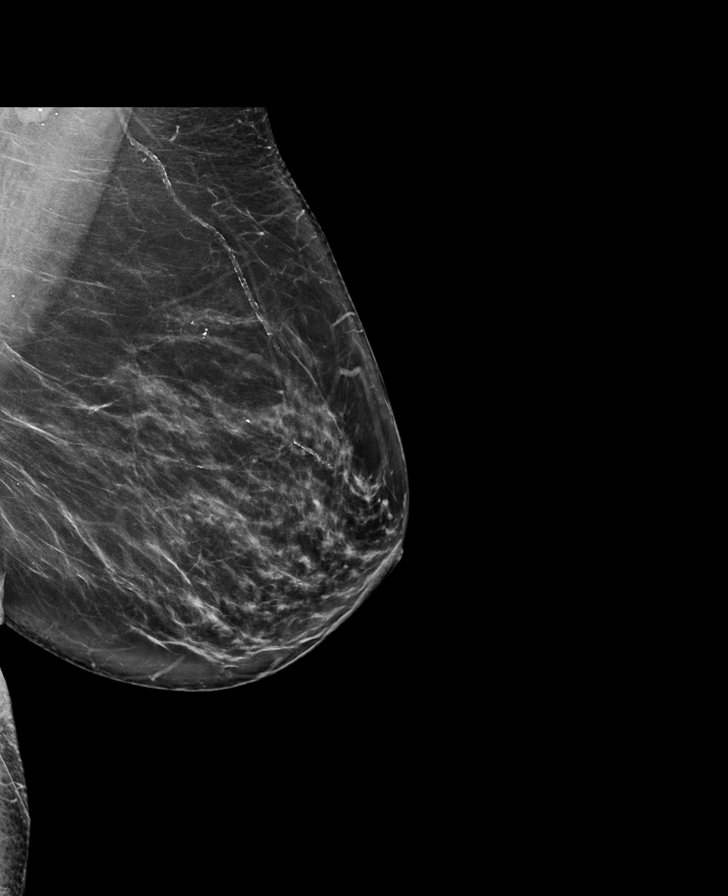

[R MLO synth-2D]
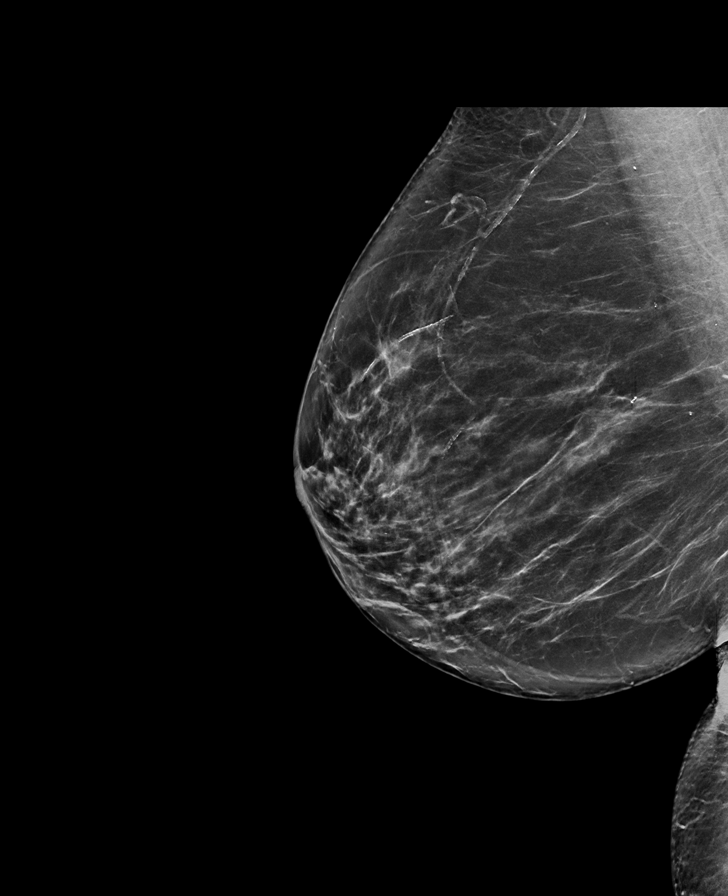

[L CC synth-2D]
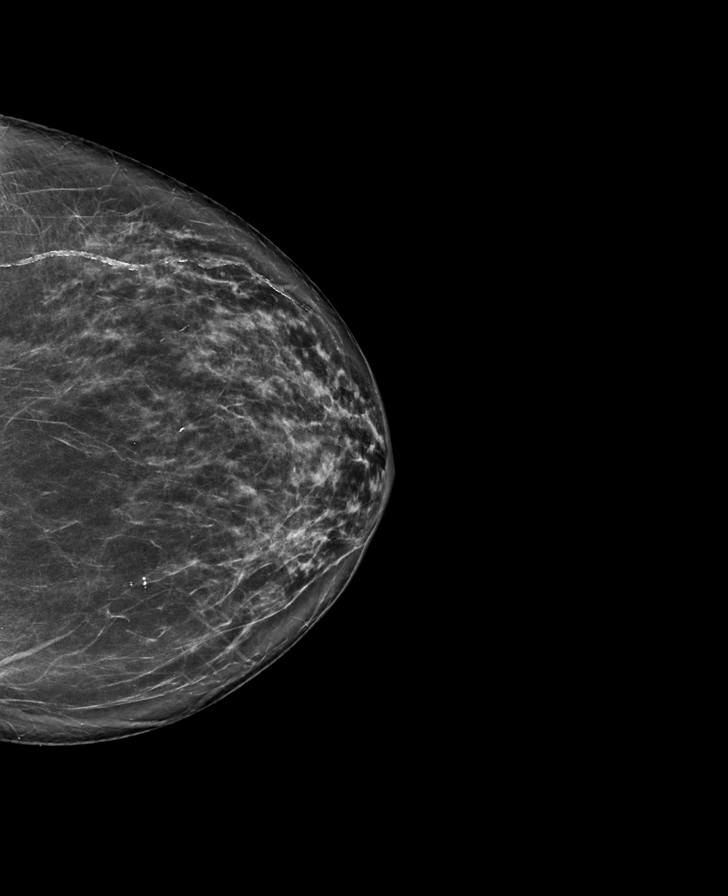

[R MLO tomo · tomo slice 41/82.0]
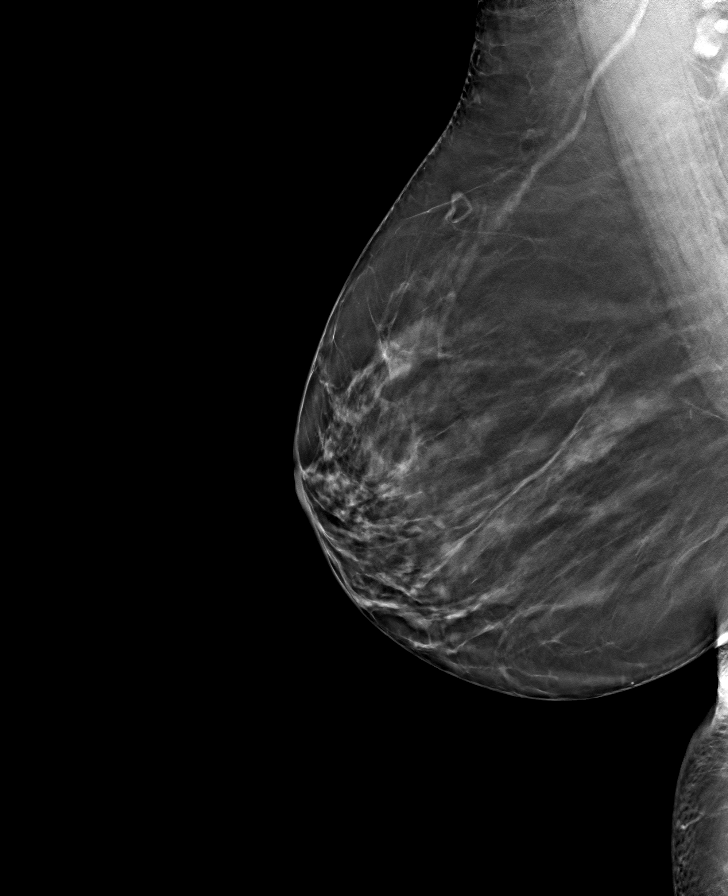

[L CC tomo · tomo slice 37/74.0]
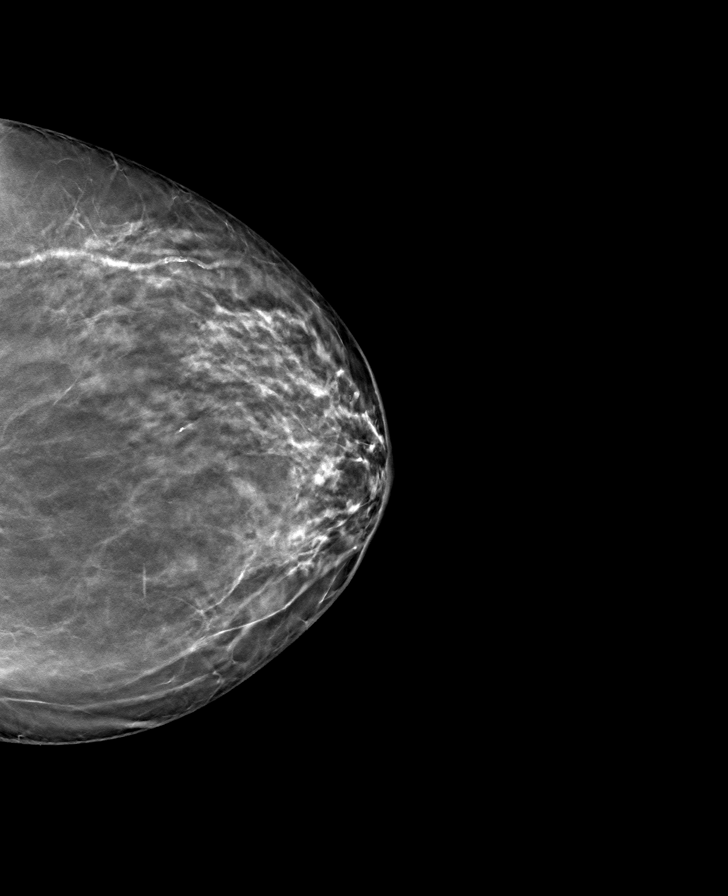

[L MLO tomo · tomo slice 43/85.0]
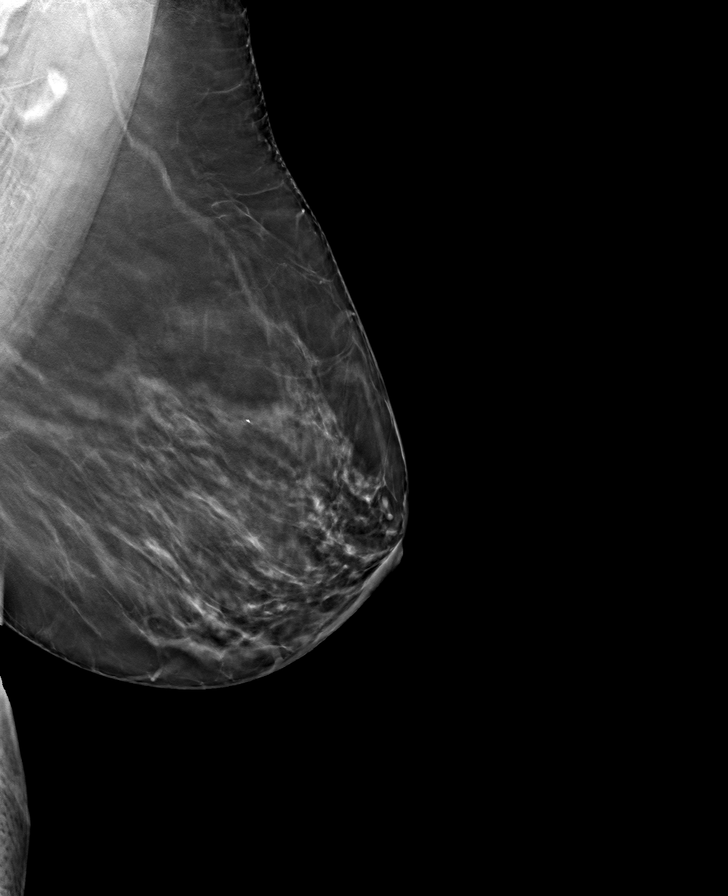

[R CC tomo · tomo slice 36/71.0]
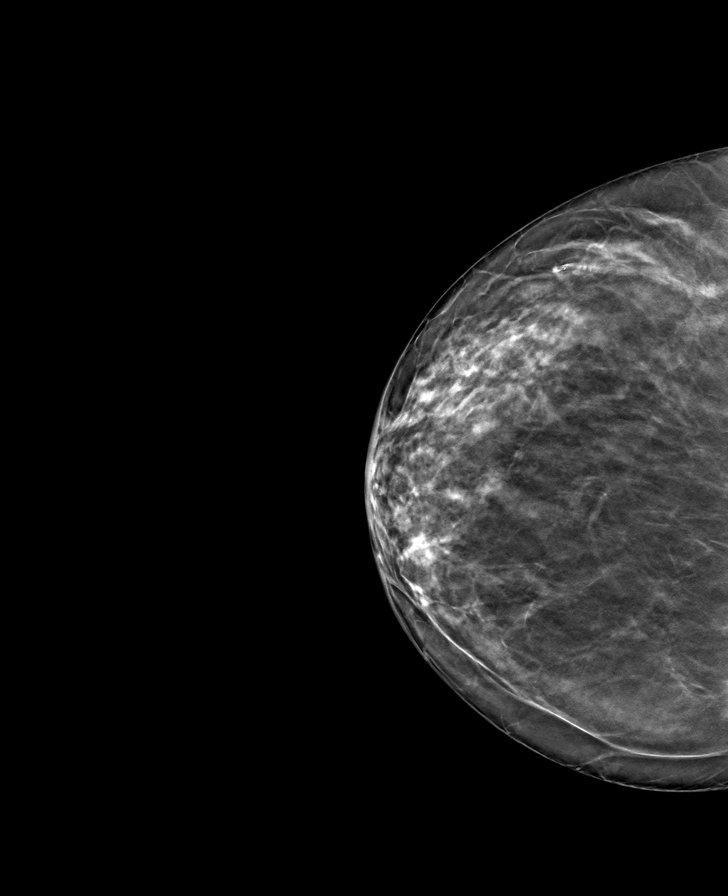

[8 of 24 positions shown; findings below may reference images not displayed]

ACR Breast Density Category b: There are scattered areas of
fibroglandular density.
FINDINGS: There are no findings suspicious for malignancy. Images were
processed with CAD.
IMPRESSION: No mammographic evidence of malignancy. A result letter of this
screening mammogram will be mailed directly to the patient.

RECOMMENDATION:
Screening mammogram in one year. (Code:[id number])

BI-RADS CATEGORY  1: Negative.

## 2021-06-22 ENCOUNTER — Other Ambulatory Visit: Payer: Self-pay | Admitting: Internal Medicine

## 2021-06-22 DIAGNOSIS — Z1231 Encounter for screening mammogram for malignant neoplasm of breast: Secondary | ICD-10-CM

## 2021-07-08 ENCOUNTER — Ambulatory Visit: Payer: Medicare Other

## 2021-07-18 ENCOUNTER — Ambulatory Visit (INDEPENDENT_AMBULATORY_CARE_PROVIDER_SITE_OTHER): Payer: Medicare Other

## 2021-07-18 VITALS — Ht 62.99 in | Wt 162.0 lb

## 2021-07-18 DIAGNOSIS — Z0000 Encounter for general adult medical examination without abnormal findings: Secondary | ICD-10-CM | POA: Diagnosis not present

## 2021-07-18 DIAGNOSIS — Z780 Asymptomatic menopausal state: Secondary | ICD-10-CM

## 2021-07-18 NOTE — Progress Notes (Addendum)
Subjective:   Terri Andrade is a 71 y.o. female who presents for Medicare Annual (Subsequent) preventive examination.  Review of Systems    No ROS.  Medicare Wellness Virtual Visit.  Visual/audio telehealth visit, UTA vital signs.   See social history for additional risk factors.   Cardiac Risk Factors include: advanced age (>77men, >2 women)     Objective:    Today's Vitals   07/18/21 1451  Weight: 162 lb (73.5 kg)  Height: 5' 2.99" (1.6 m)   Body mass index is 28.71 kg/m.  Advanced Directives 07/18/2021 07/07/2020  Does Patient Have a Medical Advance Directive? Yes Yes  Type of Paramedic of [place];Living will  Does patient want to make changes to medical advance directive? No - Patient declined No - Patient declined  Copy of [place] in Chart? No - copy requested No - copy requested    Current Medications (verified) Outpatient Encounter Medications as of 07/18/2021  Medication Sig   albuterol (VENTOLIN HFA) 108 (90 Base) MCG/ACT inhaler Inhale 1-2 puffs into the lungs every 6 (six) hours as needed (cough).   aspirin 81 MG EC tablet Take by mouth.   Multiple Vitamin (MULTI-VITAMIN) tablet Take 1 tablet by mouth daily.   omeprazole (PRILOSEC) 40 MG capsule TAKE 1 CAPSULE BY MOUTH EVERY DAY   triamcinolone ointment (KENALOG) 0.1 % Apply 1 application topically 2 (two) times daily.   [DISCONTINUED] amoxicillin-clavulanate (AUGMENTIN) 875-125 MG tablet Take 1 tablet by mouth every 12 (twelve) hours.   [DISCONTINUED] escitalopram (LEXAPRO) 5 MG tablet TAKE 1 TABLET (5 MG TOTAL) BY MOUTH DAILY.   [DISCONTINUED] guaiFENesin-codeine (CHERATUSSIN AC) 100-10 MG/5ML syrup Take 5 mLs by mouth 3 (three) times daily as needed for cough.   [DISCONTINUED] methylPREDNISolone (MEDROL DOSEPAK) 4 MG TBPK tablet Use dosepak as directed   No facility-administered encounter medications on file as of 07/18/2021.     Allergies (verified) Patient has no known allergies.   History: Past Medical History:  Diagnosis Date   Chronic bronchitis ([place])    Lump or mass in breast 06/12/2012   Treadmill stress test negative for angina pectoris 06/13/2007   Past Surgical History:  Procedure Laterality Date   BREAST BIOPSY Right 12-25-2012    BENIGN BREAST TISSUE WITH FOCAL COLUMNAR CELL CHANGE, USUAL   CHOLECYSTECTOMY  2004   SPINE SURGERY  2007   Lumbar disk,  Terri Andrade   TUBAL LIGATION     Family History  Problem Relation Age of Onset   Mental illness Mother        bipolar disorder   Cancer Father 31       tobacco related CA   Cancer Brother        tobacco related CA   Birth defects Neg Hx    Breast cancer Neg Hx    Social History   Socioeconomic History   Marital status: Married    Spouse name: Not on file   Number of children: Not on file   Years of education: Not on file   Highest education level: Not on file  Occupational History   Not on file  Tobacco Use   Smoking status: Never   Smokeless tobacco: Never  Substance and Sexual Activity   Alcohol use: Yes    Comment: social   Drug use: No   Sexual activity: Not on file  Other Topics Concern   Not on file  Social History Narrative   Not  on file   Social Determinants of Health   Financial Resource Strain: Low Risk    Difficulty of Paying Living Expenses: Not hard at all  Food Insecurity: No Food Insecurity   Worried About Charity fundraiser in the Last Year: Never true   Arboriculturist in the Last Year: Never true  Transportation Needs: No Transportation Needs   Lack of Transportation (Medical): No   Lack of Transportation (Non-Medical): No  Physical Activity: Not on file  Stress: No Stress Concern Present   Feeling of Stress : Not at all  Social Connections: Unknown   Frequency of Communication with Friends and Family: More than three times a week   Frequency of Social Gatherings with Friends and Family: More than  three times a week   Attends Religious Services: Not on Electrical engineer or Organizations: Not on file   Attends Archivist Meetings: Not on file   Marital Status: Married    Tobacco Counseling Counseling given: Not Answered   Clinical Intake:  Pre-visit preparation completed: Yes        Diabetes: No  How often do you need to have someone help you when you read instructions, pamphlets, or other written materials from your doctor or pharmacy?: 1 - Never    Interpreter Needed?: No    Activities of Daily Living In your present state of health, do you have any difficulty performing the following activities: 07/18/2021  Hearing? N  Vision? N  Difficulty concentrating or making decisions? N  Walking or climbing stairs? N  Dressing or bathing? N  Doing errands, shopping? N  Preparing Food and eating ? N  Using the Toilet? N  In the past six months, have you accidently leaked urine? N  Do you have problems with loss of bowel control? N  Managing your Medications? N  Managing your Finances? N  Housekeeping or managing your Housekeeping? N  Some recent data might be hidden   Patient Care Team: Crecencio Mc, MD as PCP - General (Internal Medicine) Bary Castilla Forest Gleason, MD as Consulting Physician (General Surgery) Crecencio Mc, MD (Internal Medicine)  Indicate any recent Medical Services you may have received from other than [facility] providers in the past year (date may be approximate).     Assessment:   This is a routine wellness examination for Terri Andrade.  Virtual Visit via Telephone Note  I connected with  Bridgette Habermann on 07/18/21 at  2:45 PM EST by telephone and verified that I am speaking with the correct person using two identifiers.  Persons participating in the virtual visit: patient/Nurse Health Advisor   I discussed the limitations, risks, security and privacy concerns of performing an evaluation and management service by telephone and the  availability of in person appointments. The patient expressed understanding and agreed to proceed.  Interactive audio and video telecommunications were attempted between this nurse and patient, however failed, due to patient having technical difficulties OR patient did not have access to video capability.  We continued and completed visit with audio only.  Some vital signs may be absent or patient reported.   Hearing/Vision screen Hearing Screening - Comments:: Patient is able to hear conversational tones without difficulty.  No issues reported. Vision Screening - Comments:: Wears corrective lenses   Dietary issues and exercise activities discussed: Current Exercise Habits: Home exercise routine, Type of exercise: walking, Intensity: Mild Healthy diet Good water intake   Goals Addressed  This Visit's Progress     Patient Stated     I would like to lose about 40lbs (pt-stated)        Weight watchers Walk more for exercise        Depression Screen PHQ 2/9 Scores 07/18/2021 07/20/2020 07/07/2020 02/03/2019 12/04/2016 03/04/2013  PHQ - 2 Score 0 0 0 0 0 0  PHQ- 9 Score - - - 0 0 -    Fall Risk Fall Risk  07/18/2021 07/20/2020 07/07/2020 03/24/2019 02/03/2019  Falls in the past year? 0 0 0 0 0  Number falls in past yr: 0 0 0 - -  Injury with Fall? - 0 0 - -  Follow up Falls evaluation completed Falls evaluation completed Falls evaluation completed Falls evaluation completed -    FALL RISK PREVENTION PERTAINING TO THE HOME: Home free of loose throw rugs in walkways, pet beds, electrical cords, etc? Yes  Adequate lighting in your home to reduce risk of falls? Yes   ASSISTIVE DEVICES UTILIZED TO PREVENT FALLS: Life alert? No  Use of a cane, walker or w/c? No   TIMED UP AND GO: Was the test performed? No .   Cognitive Function:  Patient is alert and oriented x3.    Immunizations Immunization History  Administered Date(s) Administered   Fluad Quad(high Dose 65+)  02/20/2019, 03/26/2021   Influenza Split 04/01/2014, 03/20/2018   Influenza, High Dose Seasonal PF 03/04/2020   Influenza,inj,Quad PF,6+ Mos 03/27/2017   Influenza-Unspecified 02/27/2013, 03/10/2015, 03/08/2020   PFIZER(Purple Top)SARS-COV-2 Vaccination 08/14/2019, 09/04/2019   Pneumococcal Conjugate-13 05/13/2015   Pneumococcal Polysaccharide-23 04/10/2014, 03/24/2019   Tdap 03/04/2013   Zoster Recombinat (Shingrix) 05/27/2019, 08/04/2019   Zoster, Live 04/07/2012   Screening Tests Health Maintenance  Topic Date Due   DEXA SCAN  Never done   MAMMOGRAM  05/17/2022   Fecal DNA (Cologuard)  06/29/2022   TETANUS/TDAP  03/05/2023   Pneumonia Vaccine 47+ Years old  Completed   INFLUENZA VACCINE  Completed   Hepatitis C Screening  Completed   Zoster Vaccines- Shingrix  Completed   HPV VACCINES  Aged Out   COLONOSCOPY (Pts 45-24yrs Insurance coverage will need to be confirmed)  Discontinued   COVID-19 Vaccine  Discontinued   Health Maintenance Health Maintenance Due  Topic Date Due   DEXA SCAN  Never done   Cologuard- due 06/29/22.  Mammogram- scheduled 07/27/21.   Dexa Scan- ordered today.   Lung Cancer Screening: (Low Dose CT Chest recommended if Age 70-80 years, 30 pack-year currently smoking OR have quit w/in 15years.) does not qualify.   Vision Screening: Recommended annual ophthalmology exams for early detection of glaucoma and other disorders of the eye.  Dental Screening: Recommended annual dental exams for proper oral hygiene  Community Resource Referral / Chronic Care Management: CRR required this visit?  No   CCM required this visit?  No      Plan:   Keep all routine maintenance appointments.   I have personally reviewed and noted the following in the patients chart:   Medical and social history Use of alcohol, tobacco or illicit drugs  Current medications and supplements including opioid prescriptions. Not taking opioid.  Functional ability and  status Nutritional status Physical activity Advanced directives List of other physicians Hospitalizations, surgeries, and ER visits in previous 12 months Vitals Screenings to include cognitive, depression, and falls Referrals and appointments  In addition, I have reviewed and discussed with patient certain preventive protocols, quality metrics, and best practice recommendations. A written  personalized care plan for preventive services as well as general preventive health recommendations were provided to patient via mychart.     OBrien-Blaney, Danie Diehl L, LPN   12/13/6000     I have reviewed the above information and agree with above.   Deborra Medina, MD

## 2021-07-18 NOTE — Patient Instructions (Addendum)
°  Terri Andrade , Thank you for taking time to come for your Medicare Wellness Visit. I appreciate your ongoing commitment to your health goals. Please review the following plan we discussed and let me know if I can assist you in the future.   These are the goals we discussed:  Goals       Patient Stated     I would like to lose about 40lbs (pt-stated)      Weight watchers Walk more for exercise         This is a list of the screening recommended for you and due dates:  Health Maintenance  Topic Date Due   DEXA scan (bone density measurement)  Never done   Mammogram  05/17/2022   Cologuard (Stool DNA test)  06/29/2022   Tetanus Vaccine  03/05/2023   Pneumonia Vaccine  Completed   Flu Shot  Completed   Hepatitis C Screening: USPSTF Recommendation to screen - Ages 18-79 yo.  Completed   Zoster (Shingles) Vaccine  Completed   HPV Vaccine  Aged Out   Colon Cancer Screening  Discontinued   COVID-19 Vaccine  Discontinued

## 2021-07-21 ENCOUNTER — Encounter: Payer: Self-pay | Admitting: Internal Medicine

## 2021-07-21 ENCOUNTER — Other Ambulatory Visit: Payer: Self-pay

## 2021-07-21 ENCOUNTER — Ambulatory Visit (INDEPENDENT_AMBULATORY_CARE_PROVIDER_SITE_OTHER): Payer: Medicare Other | Admitting: Internal Medicine

## 2021-07-21 VITALS — BP 122/76 | HR 76 | Temp 97.7°F | Ht 62.0 in | Wt 163.0 lb

## 2021-07-21 DIAGNOSIS — R7301 Impaired fasting glucose: Secondary | ICD-10-CM

## 2021-07-21 DIAGNOSIS — E559 Vitamin D deficiency, unspecified: Secondary | ICD-10-CM

## 2021-07-21 DIAGNOSIS — E785 Hyperlipidemia, unspecified: Secondary | ICD-10-CM | POA: Diagnosis not present

## 2021-07-21 DIAGNOSIS — R5383 Other fatigue: Secondary | ICD-10-CM | POA: Diagnosis not present

## 2021-07-21 DIAGNOSIS — Z780 Asymptomatic menopausal state: Secondary | ICD-10-CM | POA: Diagnosis not present

## 2021-07-21 DIAGNOSIS — E663 Overweight: Secondary | ICD-10-CM

## 2021-07-21 DIAGNOSIS — J4520 Mild intermittent asthma, uncomplicated: Secondary | ICD-10-CM

## 2021-07-21 LAB — CBC WITH DIFFERENTIAL/PLATELET
Basophils Absolute: 0.1 10*3/uL (ref 0.0–0.1)
Basophils Relative: 0.9 % (ref 0.0–3.0)
Eosinophils Absolute: 0.2 10*3/uL (ref 0.0–0.7)
Eosinophils Relative: 2.5 % (ref 0.0–5.0)
HCT: 46.2 % — ABNORMAL HIGH (ref 36.0–46.0)
Hemoglobin: 15.1 g/dL — ABNORMAL HIGH (ref 12.0–15.0)
Lymphocytes Relative: 28.4 % (ref 12.0–46.0)
Lymphs Abs: 2.2 10*3/uL (ref 0.7–4.0)
MCHC: 32.8 g/dL (ref 30.0–36.0)
MCV: 97.9 fl (ref 78.0–100.0)
Monocytes Absolute: 0.8 10*3/uL (ref 0.1–1.0)
Monocytes Relative: 9.8 % (ref 3.0–12.0)
Neutro Abs: 4.5 10*3/uL (ref 1.4–7.7)
Neutrophils Relative %: 58.4 % (ref 43.0–77.0)
Platelets: 413 10*3/uL — ABNORMAL HIGH (ref 150.0–400.0)
RBC: 4.72 Mil/uL (ref 3.87–5.11)
RDW: 11.9 % (ref 11.5–15.5)
WBC: 7.8 10*3/uL (ref 4.0–10.5)

## 2021-07-21 LAB — TSH: TSH: 1.05 u[IU]/mL (ref 0.35–5.50)

## 2021-07-21 LAB — COMPREHENSIVE METABOLIC PANEL
ALT: 26 U/L (ref 0–35)
AST: 32 U/L (ref 0–37)
Albumin: 4.2 g/dL (ref 3.5–5.2)
Alkaline Phosphatase: 77 U/L (ref 39–117)
BUN: 20 mg/dL (ref 6–23)
CO2: 29 mEq/L (ref 19–32)
Calcium: 9.8 mg/dL (ref 8.4–10.5)
Chloride: 100 mEq/L (ref 96–112)
Creatinine, Ser: 0.95 mg/dL (ref 0.40–1.20)
GFR: 61.31 mL/min (ref >60.00)
Glucose, Bld: 99 mg/dL (ref 70–99)
Potassium: 4.3 mEq/L (ref 3.5–5.1)
Sodium: 138 mEq/L (ref 135–145)
Total Bilirubin: 0.5 mg/dL (ref 0.2–1.2)
Total Protein: 7.6 g/dL (ref 6.0–8.3)

## 2021-07-21 LAB — LIPID PANEL
Cholesterol: 195 mg/dL (ref 0–200)
HDL: 44.4 mg/dL (ref >39.00)
LDL Cholesterol: 113 mg/dL — ABNORMAL HIGH (ref 0–99)
NonHDL: 151.01
Total CHOL/HDL Ratio: 4
Triglycerides: 189 mg/dL — ABNORMAL HIGH (ref 0.0–149.0)
VLDL: 37.8 mg/dL (ref 0.0–40.0)

## 2021-07-21 LAB — HEMOGLOBIN A1C: Hgb A1c MFr Bld: 5.8 % (ref 4.6–6.5)

## 2021-07-21 LAB — VITAMIN D 25 HYDROXY (VIT D DEFICIENCY, FRACTURES): VITD: 86.18 ng/mL (ref 30.00–100.00)

## 2021-07-21 NOTE — Progress Notes (Signed)
Patient ID: Terri Andrade, female    DOB: 23-Nov-1950  Age: 71 y.o. MRN: [id number]  The patient is here for follow up and management of other chronic and acute problems.  This visit occurred during the SARS-CoV-2 public health emergency.  Safety protocols were in place, including screening questions prior to the visit, additional usage of staff PPE, and extensive cleaning of exam room while observing appropriate contact time as indicated for disinfecting solutions.     The risk factors are reflected in the social history.  The roster of all physicians providing medical care to patient - is listed in the Snapshot section of the chart.  Activities of daily living:  The patient is 100% independent in all ADLs: dressing, toileting, feeding as well as independent mobility  Home safety : The patient has smoke detectors in the home. They wear seatbelts.  There are no firearms at home. There is no violence in the home.   There is no risks for hepatitis, STDs or HIV. There is no   history of blood transfusion. They have no travel history to infectious disease endemic areas of the world.  The patient has seen their dentist in the last six month. They have seen their eye doctor in the last year. They admit to slight hearing difficulty with regard to whispered voices and some television programs.  They have deferred audiologic testing in the last year.  They do not  have excessive sun exposure. Discussed the need for sun protection: hats, long sleeves and use of sunscreen if there is significant sun exposure.   Diet: the importance of a healthy diet is discussed. They do have a healthy diet.  The benefits of regular aerobic exercise were discussed. She walks 4 times per week ,  20 minutes.   Depression screen: there are no signs or vegative symptoms of depression- irritability, change in appetite, anhedonia, sadness/tearfullness.  Cognitive assessment: the patient manages all their financial and personal  affairs and is actively engaged. They could relate day,date,year and events; recalled 2/3 objects at 3 minutes; performed clock-face test normally.  The following portions of the patient's history were reviewed and updated as appropriate: allergies, current medications, past family history, past medical history,  past surgical history, past social history  and problem list.  Visual acuity was not assessed per patient preference since she has regular follow up with her ophthalmologist. Hearing and body mass index were assessed and reviewed.   During the course of the visit the patient was educated and counseled about appropriate screening and preventive services including : fall prevention , diabetes screening, nutrition counseling, colorectal cancer screening, and recommended immunizations.    CC: The primary encounter diagnosis was Hyperlipidemia LDL goal <160. Diagnoses of Fatigue, unspecified type, Impaired fasting glucose, Postmenopausal estrogen deficiency, Vitamin D deficiency, Mild intermittent asthma, unspecified whether complicated, and Overweight (BMI 25.0-29.9) were also pertinent to this visit.  1) Mild intermittent asthma diagnosed by [facility] with methacholine challenge.  No weekly symptoms.    last use of albuterol was in August    2) Rosacea : using a cream prescribed by dermatology.  Aggravated by hot drinks.   3) Returned form [place] on Sunday had a wonderful time despite 10 days of intermittent rain.    4) obesity:  joined Weight Watchers on Monday .  She has set a goal of 150 lbs.  Plans to start walking  for exercise  .  History Terri Andrade has a past medical history of Chronic bronchitis (  [place]), Lump or mass in breast (06/12/2012), and Treadmill stress test negative for angina pectoris (06/13/2007).   She has a past surgical history that includes Spine surgery (2007); Cholecystectomy (2004); Tubal ligation; and Breast biopsy (Right, 12-25-2012).   Her family history  includes Cancer in her brother; Cancer (age of onset: 70) in her father; Mental illness in her mother.She reports that she has never smoked. She has never used smokeless tobacco. She reports current alcohol use. She reports that she does not use drugs.  Outpatient Medications Prior to Visit  Medication Sig Dispense Refill   albuterol (VENTOLIN HFA) 108 (90 Base) MCG/ACT inhaler Inhale 1-2 puffs into the lungs every 6 (six) hours as needed (cough). 6.7 g 0   aspirin 81 MG EC tablet Take by mouth.     Multiple Vitamin (MULTI-VITAMIN) tablet Take 1 tablet by mouth daily.     omeprazole (PRILOSEC) 40 MG capsule TAKE 1 CAPSULE BY MOUTH EVERY DAY 90 capsule 2   triamcinolone ointment (KENALOG) 0.1 % Apply 1 application topically 2 (two) times daily.     No facility-administered medications prior to visit.    Review of Systems  Patient denies headache, fevers, malaise, unintentional weight loss, skin rash, eye pain, sinus congestion and sinus pain, sore throat, dysphagia,  hemoptysis , cough, dyspnea, wheezing, chest pain, palpitations, orthopnea, edema, abdominal pain, nausea, melena, diarrhea, constipation, flank pain, dysuria, hematuria, urinary  Frequency, nocturia, numbness, tingling, seizures,  Focal weakness, Loss of consciousness,  Tremor, insomnia, depression, anxiety, and suicidal ideation.     Objective:  BP 122/76 (BP Location: Left Arm, Patient Position: Sitting, Cuff Size: Small)    Pulse 76    Temp 97.7 F (36.5 C)    Ht $R'5\' 2"'Xd$  (1.575 m)    Wt 163 lb (73.9 kg)    SpO2 98%    BMI 29.81 kg/m   Physical Exam  General appearance: alert, cooperative and appears stated age Ears: normal TM's and external ear canals both ears Throat: lips, mucosa, and tongue normal; teeth and gums normal Neck: no adenopathy, no carotid bruit, supple, symmetrical, trachea midline and thyroid not enlarged, symmetric, no tenderness/mass/nodules Back: symmetric, no curvature. ROM normal. No CVA  tenderness. Lungs: clear to auscultation bilaterally Heart: regular rate and rhythm, S1, S2 normal, no murmur, click, rub or gallop Abdomen: soft, non-tender; bowel sounds normal; no masses,  no organomegaly Pulses: 2+ and symmetric Skin: Skin color, texture, turgor normal. No rashes or lesions Lymph nodes: Cervical, supraclavicular, and axillary nodes normal.   Assessment & Plan:   Problem List Items Addressed This Visit     Hyperlipidemia LDL goal <160 - Primary    Improved with dietary changes.  No statin therapy indicated.   Lab Results  Component Value Date   CHOL 195 07/21/2021   HDL 44.40 07/21/2021   LDLCALC 113 (H) 07/21/2021   LDLDIRECT 166.0 07/20/2020   TRIG 189.0 (H) 07/21/2021   CHOLHDL 4 07/21/2021         Relevant Orders   Lipid Profile (Completed)   Intermittent asthma    Diagnosed with methacholine challenge/PFTs.  Has not used albuterol since August.       Overweight (BMI 25.0-29.9)    I have addressed  BMI and recommended a low glycemic index diet utilizing smaller more frequent meals to increase metabolism.  I have also recommended that patient start exercising with a goal of 30 minutes of aerobic exercise a minimum of 5 days per week.  Other Visit Diagnoses     Fatigue, unspecified type       Relevant Orders   CBC with Differential/Platelet (Completed)   TSH (Completed)   Impaired fasting glucose       Relevant Orders   Comp Met (CMET) (Completed)   HgB A1c (Completed)   Postmenopausal estrogen deficiency       Relevant Orders   DG Bone Density   Vitamin D deficiency       Relevant Orders   VITAMIN D 25 Hydroxy (Vit-D Deficiency, Fractures) (Completed)       I am having Bridgette Habermann maintain her Multi-Vitamin, triamcinolone ointment, aspirin, omeprazole, and albuterol.  No orders of the defined types were placed in this encounter.   There are no discontinued medications.  Follow-up: Return in about 1 year (around  07/21/2022).   Crecencio Mc, MD

## 2021-07-21 NOTE — Patient Instructions (Signed)
Good to see you!   Your  DEXA scan  been ordered.  You are (required) to call [facility] to schedule your test  and their phone number is [phone]    Make your first goal with the weight loss 150 lbs.  (You were 150 lbs in 2018)

## 2021-07-23 DIAGNOSIS — J4520 Mild intermittent asthma, uncomplicated: Secondary | ICD-10-CM | POA: Insufficient documentation

## 2021-07-23 NOTE — Assessment & Plan Note (Addendum)
Improved with dietary changes.  No statin therapy indicated.   Lab Results  Component Value Date   CHOL 195 07/21/2021   HDL 44.40 07/21/2021   LDLCALC 113 (H) 07/21/2021   LDLDIRECT 166.0 07/20/2020   TRIG 189.0 (H) 07/21/2021   CHOLHDL 4 07/21/2021

## 2021-07-23 NOTE — Assessment & Plan Note (Signed)
I have addressed  BMI and recommended a low glycemic index diet utilizing smaller more frequent meals to increase metabolism.  I have also recommended that patient start exercising with a goal of 30 minutes of aerobic exercise a minimum of 5 days per week.  

## 2021-07-23 NOTE — Assessment & Plan Note (Signed)
Diagnosed with methacholine challenge/PFTs.  Has not used albuterol since August.

## 2021-09-13 ENCOUNTER — Ambulatory Visit
Admission: RE | Admit: 2021-09-13 | Discharge: 2021-09-13 | Disposition: A | Discharge status: Home / Self Care | Payer: Medicare Other | Source: Ambulatory Visit | Attending: Internal Medicine | Admitting: Internal Medicine

## 2021-09-13 DIAGNOSIS — Z1231 Encounter for screening mammogram for malignant neoplasm of breast: Secondary | ICD-10-CM | POA: Diagnosis present

## 2021-09-24 ENCOUNTER — Other Ambulatory Visit: Payer: Self-pay | Admitting: Internal Medicine

## 2022-06-27 ENCOUNTER — Other Ambulatory Visit: Payer: Self-pay | Admitting: Internal Medicine

## 2022-07-24 ENCOUNTER — Ambulatory Visit (INDEPENDENT_AMBULATORY_CARE_PROVIDER_SITE_OTHER): Payer: Medicare Other | Admitting: Internal Medicine

## 2022-07-24 ENCOUNTER — Encounter: Payer: Self-pay | Admitting: Internal Medicine

## 2022-07-24 VITALS — BP 128/74 | HR 90 | Temp 97.5°F | Ht 63.0 in | Wt 162.0 lb

## 2022-07-24 DIAGNOSIS — J4520 Mild intermittent asthma, uncomplicated: Secondary | ICD-10-CM

## 2022-07-24 DIAGNOSIS — E559 Vitamin D deficiency, unspecified: Secondary | ICD-10-CM | POA: Diagnosis not present

## 2022-07-24 DIAGNOSIS — E663 Overweight: Secondary | ICD-10-CM

## 2022-07-24 DIAGNOSIS — Z780 Asymptomatic menopausal state: Secondary | ICD-10-CM | POA: Diagnosis not present

## 2022-07-24 DIAGNOSIS — Z1211 Encounter for screening for malignant neoplasm of colon: Secondary | ICD-10-CM

## 2022-07-24 DIAGNOSIS — E785 Hyperlipidemia, unspecified: Secondary | ICD-10-CM | POA: Diagnosis not present

## 2022-07-24 DIAGNOSIS — Z0000 Encounter for general adult medical examination without abnormal findings: Secondary | ICD-10-CM

## 2022-07-24 DIAGNOSIS — K2100 Gastro-esophageal reflux disease with esophagitis, without bleeding: Secondary | ICD-10-CM

## 2022-07-24 DIAGNOSIS — E2839 Other primary ovarian failure: Secondary | ICD-10-CM | POA: Diagnosis not present

## 2022-07-24 DIAGNOSIS — Z1231 Encounter for screening mammogram for malignant neoplasm of breast: Secondary | ICD-10-CM

## 2022-07-24 LAB — CBC WITH DIFFERENTIAL/PLATELET
Basophils Absolute: 0.1 10*3/uL (ref 0.0–0.1)
Basophils Relative: 1.2 % (ref 0.0–3.0)
Eosinophils Absolute: 0.2 10*3/uL (ref 0.0–0.7)
Eosinophils Relative: 2 % (ref 0.0–5.0)
HCT: 43.7 % (ref 36.0–46.0)
Hemoglobin: 15 g/dL (ref 12.0–15.0)
Lymphocytes Relative: 21.3 % (ref 12.0–46.0)
Lymphs Abs: 2.1 10*3/uL (ref 0.7–4.0)
MCHC: 34.3 g/dL (ref 30.0–36.0)
MCV: 98.2 fl (ref 78.0–100.0)
Monocytes Absolute: 0.8 10*3/uL (ref 0.1–1.0)
Monocytes Relative: 8.2 % (ref 3.0–12.0)
Neutro Abs: 6.7 10*3/uL (ref 1.4–7.7)
Neutrophils Relative %: 67.3 % (ref 43.0–77.0)
Platelets: 399 10*3/uL (ref 150.0–400.0)
RBC: 4.45 Mil/uL (ref 3.87–5.11)
RDW: 12 % (ref 11.5–15.5)
WBC: 9.9 10*3/uL (ref 4.0–10.5)

## 2022-07-24 LAB — COMPREHENSIVE METABOLIC PANEL
ALT: 15 U/L (ref 0–35)
AST: 17 U/L (ref 0–37)
Albumin: 4.2 g/dL (ref 3.5–5.2)
Alkaline Phosphatase: 63 U/L (ref 39–117)
BUN: 13 mg/dL (ref 6–23)
CO2: 27 mEq/L (ref 19–32)
Calcium: 9.5 mg/dL (ref 8.4–10.5)
Chloride: 101 mEq/L (ref 96–112)
Creatinine, Ser: 0.92 mg/dL (ref 0.40–1.20)
GFR: 63.27 mL/min (ref >60.00)
Glucose, Bld: 98 mg/dL (ref 70–99)
Potassium: 4.2 mEq/L (ref 3.5–5.1)
Sodium: 137 mEq/L (ref 135–145)
Total Bilirubin: 0.5 mg/dL (ref 0.2–1.2)
Total Protein: 7.2 g/dL (ref 6.0–8.3)

## 2022-07-24 LAB — LIPID PANEL
Cholesterol: 178 mg/dL (ref 0–200)
HDL: 40.5 mg/dL (ref >39.00)
LDL Cholesterol: 101 mg/dL — ABNORMAL HIGH (ref 0–99)
NonHDL: 137.82
Total CHOL/HDL Ratio: 4
Triglycerides: 183 mg/dL — ABNORMAL HIGH (ref 0.0–149.0)
VLDL: 36.6 mg/dL (ref 0.0–40.0)

## 2022-07-24 LAB — VITAMIN D 25 HYDROXY (VIT D DEFICIENCY, FRACTURES): VITD: 43.89 ng/mL (ref 30.00–100.00)

## 2022-07-24 LAB — TSH: TSH: 1.7 u[IU]/mL (ref 0.35–5.50)

## 2022-07-24 NOTE — Assessment & Plan Note (Addendum)

## 2022-07-24 NOTE — Assessment & Plan Note (Signed)
Patient's BMI  Was reviewed and discussed with patient.  Recommended folLowing a low glycemic index diet, which was reviewed today .  Patient  is NOT exercising regularly. And encouraged to start a walking program

## 2022-07-24 NOTE — Assessment & Plan Note (Signed)
Improved with dietary changes.  No statin therapy indicated.   Lab Results  Component Value Date   CHOL 178 07/24/2022   HDL 40.50 07/24/2022   LDLCALC 101 (H) 07/24/2022   LDLDIRECT 166.0 07/20/2020   TRIG 183.0 (H) 07/24/2022   CHOLHDL 4 07/24/2022

## 2022-07-24 NOTE — Assessment & Plan Note (Signed)
Managed with prilosec. The risks of long term PPI use for acid suppression in patients without documented Barretts esophagus were discussed, including the possibility of osteoporosis, iron , B12 and magnesium deficiencies,  CKD, and dementia.

## 2022-07-24 NOTE — Assessment & Plan Note (Signed)
DEXA ordered.  Natural history of osteoporosis outlined.

## 2022-07-24 NOTE — Patient Instructions (Addendum)
Ok for Terri Andrade to take Benardyl 25 mg plus mucinex dm  Avoid "d"    Your s need your tetanus-diptheria-pertussis vaccine (TDaP) this year,  but you can get it for FREE at your local pharmacy [facility] [place]   because   Your  DEXA scan  been ordered.  You are encouraged (required) to call to schedule your own  appointment at [facility]  , and their phone number is [phone]   Your annual mammogram has been ordered.  You are encouraged (required) to call to make your appointment at [place] [phone]

## 2022-07-24 NOTE — Assessment & Plan Note (Signed)
Diagnosed with methacholine challenge/PFTs.  Has not used albuterol in  6 months

## 2022-07-24 NOTE — Progress Notes (Signed)
Patient ID: Terri Andrade, female    DOB: 06-Feb-1951  Age: 72 y.o. MRN: [id number]  The patient is here for follow up and management of other chronic and acute problems.   The risk factors are reflected in the social history.  The roster of all physicians providing medical care to patient - is listed in the Snapshot section of the chart.  Activities of daily living:  The patient is 100% independent in all ADLs: dressing, toileting, feeding as well as independent mobility  Home safety : The patient has smoke detectors in the home. They wear seatbelts.  There are no firearms at home. There is no violence in the home.   There is no risks for hepatitis, STDs or HIV. There is no   history of blood transfusion. They have no travel history to infectious disease endemic areas of the world.  The patient has seen their dentist in the last six month. They have seen their eye doctor in the last year. They admit to slight hearing difficulty with regard to whispered voices and some television programs.  They have deferred audiologic testing in the last year.  They do not  have excessive sun exposure. Discussed the need for sun protection: hats, long sleeves and use of sunscreen if there is significant sun exposure.   Diet: the importance of a healthy diet is discussed. They do have a healthy diet.  The benefits of regular aerobic exercise were discussed. She walks 4 times per week ,  20 minutes.   Depression screen: there are no signs or vegative symptoms of depression- irritability, change in appetite, anhedonia, sadness/tearfullness.  Cognitive assessment: the patient manages all their financial and personal affairs and is actively engaged. They could relate day,date,year and events; recalled 2/3 objects at 3 minutes; performed clock-face test normally.  The following portions of the patient's history were reviewed and updated as appropriate: allergies, current medications, past family history, past medical  history,  past surgical history, past social history  and problem list.  Visual acuity was not assessed per patient preference since she has regular follow up with her ophthalmologist. Hearing and body mass index were assessed and reviewed.   During the course of the visit the patient was educated and counseled about appropriate screening and preventive services including : fall prevention , diabetes screening, nutrition counseling, colorectal cancer screening, and recommended immunizations.    CC: The primary encounter diagnosis was Colon cancer screening. Diagnoses of Estrogen deficiency, Postmenopausal estrogen deficiency, Breast cancer screening by mammogram, Hyperlipidemia LDL goal <160, Mild intermittent asthma, unspecified whether complicated, Vitamin D deficiency, Overweight (BMI 25.0-29.9), Encounter for preventive health examination, and Gastroesophageal reflux disease with esophagitis without hemorrhage were also pertinent to this visit.   Feels great.  Eating too much and not exercising Taking D in a multivitamin  (1000 IUs?)  Sees dermatology next week no history of skin CA   History Toshiko has a past medical history of Chronic bronchitis ([place]), Lump or mass in breast (06/12/2012), and Treadmill stress test negative for angina pectoris (06/13/2007).   She has a past surgical history that includes Spine surgery (2007); Cholecystectomy (2004); Tubal ligation; and Breast biopsy (Right, 12-25-2012).   Her family history includes Cancer in her brother; Cancer (age of onset: 50) in her father; Mental illness in her mother.She reports that she has never smoked. She has never used smokeless tobacco. She reports current alcohol use. She reports that she does not use drugs.  Outpatient Medications Prior to Visit  Medication Sig Dispense Refill   albuterol (VENTOLIN HFA) 108 (90 Base) MCG/ACT inhaler Inhale 1-2 puffs into the lungs every 6 (six) hours as needed (cough). 6.7 g 0   aspirin 81  MG EC tablet Take by mouth.     Multiple Vitamin (MULTI-VITAMIN) tablet Take 1 tablet by mouth daily.     omeprazole (PRILOSEC) 40 MG capsule TAKE 1 CAPSULE BY MOUTH EVERY DAY 90 capsule 0   triamcinolone ointment (KENALOG) 0.1 % Apply 1 application topically 2 (two) times daily.     No facility-administered medications prior to visit.    Review of Systems  Patient denies headache, fevers, malaise, unintentional weight loss, skin rash, eye pain, sinus congestion and sinus pain, sore throat, dysphagia,  hemoptysis , cough, dyspnea, wheezing, chest pain, palpitations, orthopnea, edema, abdominal pain, nausea, melena, diarrhea, constipation, flank pain, dysuria, hematuria, urinary  Frequency, nocturia, numbness, tingling, seizures,  Focal weakness, Loss of consciousness,  Tremor, insomnia, depression, anxiety, and suicidal ideation.     Objective:  BP 128/74   Pulse 90   Temp (!) 97.5 F (36.4 C) (Oral)   Ht 5' 3"$  (1.6 m)   Wt 162 lb (73.5 kg)   SpO2 96%   BMI 28.70 kg/m   Physical Exam Vitals reviewed.  Constitutional:      General: She is not in acute distress.    Appearance: Normal appearance. She is well-developed and normal weight. She is not ill-appearing, toxic-appearing or diaphoretic.  HENT:     Head: Normocephalic.     Right Ear: Tympanic membrane, ear canal and external ear normal. There is no impacted cerumen.     Left Ear: Tympanic membrane, ear canal and external ear normal. There is no impacted cerumen.     Nose: Nose normal.     Mouth/Throat:     Mouth: Mucous membranes are moist.     Pharynx: Oropharynx is clear.  Eyes:     General: No scleral icterus.       Right eye: No discharge.        Left eye: No discharge.     Conjunctiva/sclera: Conjunctivae normal.     Pupils: Pupils are equal, round, and reactive to light.  Neck:     Thyroid: No thyromegaly.     Vascular: No carotid bruit or JVD.  Cardiovascular:     Rate and Rhythm: Normal rate and regular  rhythm.     Heart sounds: Normal heart sounds.  Pulmonary:     Effort: Pulmonary effort is normal. No respiratory distress.     Breath sounds: Normal breath sounds.  Chest:  Breasts:    Breasts are symmetrical.     Right: Normal. No swelling, inverted nipple, mass, nipple discharge, skin change or tenderness.     Left: Normal. No swelling, inverted nipple, mass, nipple discharge, skin change or tenderness.  Abdominal:     General: Bowel sounds are normal.     Palpations: Abdomen is soft. There is no mass.     Tenderness: There is no abdominal tenderness. There is no guarding or rebound.  Musculoskeletal:        General: Normal range of motion.     Cervical back: Normal range of motion and neck supple.  Lymphadenopathy:     Cervical: No cervical adenopathy.     Upper Body:     Right upper body: No supraclavicular, axillary or pectoral adenopathy.     Left upper body: No supraclavicular, axillary or pectoral adenopathy.  Skin:  General: Skin is warm and dry.  Neurological:     General: No focal deficit present.     Mental Status: She is alert and oriented to person, place, and time. Mental status is at baseline.  Psychiatric:        Mood and Affect: Mood normal.        Behavior: Behavior normal.        Thought Content: Thought content normal.        Judgment: Judgment normal.     Assessment & Plan:  Colon cancer screening -     Cologuard  Estrogen deficiency  Postmenopausal estrogen deficiency Assessment & Plan: DEXA ordered.  Natural history of osteoporosis outlined.   Orders: -     DG Bone Density; Future  Breast cancer screening by mammogram -     3D Screening Mammogram, Left and Right; Future  Hyperlipidemia LDL goal <160 Assessment & Plan: Improved with dietary changes.  No statin therapy indicated.   Lab Results  Component Value Date   CHOL 178 07/24/2022   HDL 40.50 07/24/2022   LDLCALC 101 (H) 07/24/2022   LDLDIRECT 166.0 07/20/2020   TRIG 183.0  (H) 07/24/2022   CHOLHDL 4 07/24/2022     Orders: -     Comprehensive metabolic panel -     TSH -     Lipid panel  Mild intermittent asthma, unspecified whether complicated Assessment & Plan: Diagnosed with methacholine challenge/PFTs.  Has not used albuterol in  6 months  Orders: -     CBC with Differential/Platelet  Vitamin D deficiency -     VITAMIN D 25 Hydroxy (Vit-D Deficiency, Fractures)  Overweight (BMI 25.0-29.9) Assessment & Plan: Patient's BMI  Was reviewed and discussed with patient.  Recommended folLowing a low glycemic index diet, which was reviewed today .  Patient  is NOT exercising regularly. And encouraged to start a walking program     Encounter for preventive health examination Assessment & Plan: age appropriate education and counseling updated, referrals for preventative services and immunizations addressed, dietary and smoking counseling addressed, most recent labs reviewed.  I have personally reviewed and have noted:   1) the patient's medical and social history 2) The pt's use of alcohol, tobacco, and illicit drugs 3) The patient's current medications and supplements 4) Functional ability including ADL's, fall risk, home safety risk, hearing and visual impairment 5) Diet and physical activities 6) Evidence for depression or mood disorder 7) The patient's height, weight, and BMI have been recorded in the chart   I have made referrals, and provided counseling and education based on review of the above    Gastroesophageal reflux disease with esophagitis without hemorrhage Assessment & Plan: Managed with prilosec. The risks of long term PPI use for acid suppression in patients without documented Barretts esophagus were discussed, including the possibility of osteoporosis, iron , B12 and magnesium deficiencies,  CKD, and dementia.        I provided 30 minutes of  face-to-face time during this encounter reviewing patient's current problems and past  surgeries,  recent labs and imaging studies, providing counseling on the above mentioned problems , and coordination  of care .   Follow-up: No follow-ups on file.   Crecencio Mc, MD

## 2022-07-28 ENCOUNTER — Telehealth: Payer: Self-pay

## 2022-07-28 ENCOUNTER — Ambulatory Visit (INDEPENDENT_AMBULATORY_CARE_PROVIDER_SITE_OTHER): Payer: Medicare Other

## 2022-07-28 VITALS — Ht 63.0 in | Wt 162.0 lb

## 2022-07-28 DIAGNOSIS — Z0000 Encounter for general adult medical examination without abnormal findings: Secondary | ICD-10-CM

## 2022-07-28 NOTE — Patient Instructions (Addendum)
Terri Andrade , Thank you for taking time to come for your Medicare Wellness Visit. I appreciate your ongoing commitment to your health goals. Please review the following plan we discussed and let me know if I can assist you in the future.   These are the goals we discussed:  Goals       Patient Stated     I would like to lose about 40lbs (pt-stated)      Weight watchers. Walk more for exercise.        This is a list of the screening recommended for you and due dates:  Health Maintenance  Topic Date Due   DEXA scan (bone density measurement)  Never done   Cologuard (Stool DNA test)  06/29/2022   DTaP/Tdap/Td vaccine (2 - Td or Tdap) 03/05/2023   Medicare Annual Wellness Visit  07/29/2023   Mammogram  09/14/2023   Pneumonia Vaccine  Completed   Flu Shot  Completed   Hepatitis C Screening: USPSTF Recommendation to screen - Ages 18-79 yo.  Completed   Zoster (Shingles) Vaccine  Completed   HPV Vaccine  Aged Out   Colon Cancer Screening  Discontinued   COVID-19 Vaccine  Discontinued    Advanced directives: End of life planning; Advance aging; Advanced directives discussed.  Copy of current HCPOA/Living Will requested.    Conditions/risks identified: none new  Next appointment: Follow up in one year for your annual wellness visit    Preventive Care 65 Years and Older, Female Preventive care refers to lifestyle choices and visits with your health care provider that can promote health and wellness. What does preventive care include? A yearly physical exam. This is also called an annual well check. Dental exams once or twice a year. Routine eye exams. Ask your health care provider how often you should have your eyes checked. Personal lifestyle choices, including: Daily care of your teeth and gums. Regular physical activity. Eating a healthy diet. Avoiding tobacco and drug use. Limiting alcohol use. Practicing safe sex. Taking low-dose aspirin every day. Taking vitamin and  mineral supplements as recommended by your health care provider. What happens during an annual well check? The services and screenings done by your health care provider during your annual well check will depend on your age, overall health, lifestyle risk factors, and family history of disease. Counseling  Your health care provider may ask you questions about your: Alcohol use. Tobacco use. Drug use. Emotional well-being. Home and relationship well-being. Sexual activity. Eating habits. History of falls. Memory and ability to understand (cognition). Work and work Statistician. Reproductive health. Screening  You may have the following tests or measurements: Height, weight, and BMI. Blood pressure. Lipid and cholesterol levels. These may be checked every 5 years, or more frequently if you are over 74 years old. Skin check. Lung cancer screening. You may have this screening every year starting at age 51 if you have a 30-pack-year history of smoking and currently smoke or have quit within the past 15 years. Fecal occult blood test (FOBT) of the stool. You may have this test every year starting at age 65. Flexible sigmoidoscopy or colonoscopy. You may have a sigmoidoscopy every 5 years or a colonoscopy every 10 years starting at age 10. Hepatitis C blood test. Hepatitis B blood test. Sexually transmitted disease (STD) testing. Diabetes screening. This is done by checking your blood sugar (glucose) after you have not eaten for a while (fasting). You may have this done every 1-3 years. Bone density scan.  This is done to screen for osteoporosis. You may have this done starting at age 75. Mammogram. This may be done every 1-2 years. Talk to your health care provider about how often you should have regular mammograms. Talk with your health care provider about your test results, treatment options, and if necessary, the need for more tests. Vaccines  Your health care provider may recommend  certain vaccines, such as: Influenza vaccine. This is recommended every year. Tetanus, diphtheria, and acellular pertussis (Tdap, Td) vaccine. You may need a Td booster every 10 years. Zoster vaccine. You may need this after age 83. Pneumococcal 13-valent conjugate (PCV13) vaccine. One dose is recommended after age 21. Pneumococcal polysaccharide (PPSV23) vaccine. One dose is recommended after age 29. Talk to your health care provider about which screenings and vaccines you need and how often you need them. This information is not intended to replace advice given to you by your health care provider. Make sure you discuss any questions you have with your health care provider. Document Released: 06/25/2015 Document Revised: 02/16/2016 Document Reviewed: 03/30/2015 Elsevier Interactive Patient Education  2017 [place] Prevention in the Home Falls can cause injuries. They can happen to people of all ages. There are many things you can do to make your home safe and to help prevent falls. What can I do on the outside of my home? Regularly fix the edges of walkways and driveways and fix any cracks. Remove anything that might make you trip as you walk through a door, such as a raised step or threshold. Trim any bushes or trees on the path to your home. Use bright outdoor lighting. Clear any walking paths of anything that might make someone trip, such as rocks or tools. Regularly check to see if handrails are loose or broken. Make sure that both sides of any steps have handrails. Any raised decks and porches should have guardrails on the edges. Have any leaves, snow, or ice cleared regularly. Use sand or salt on walking paths during winter. Clean up any spills in your garage right away. This includes oil or grease spills. What can I do in the bathroom? Use night lights. Install grab bars by the toilet and in the tub and shower. Do not use towel bars as grab bars. Use non-skid mats or  decals in the tub or shower. If you need to sit down in the shower, use a plastic, non-slip stool. Keep the floor dry. Clean up any water that spills on the floor as soon as it happens. Remove soap buildup in the tub or shower regularly. Attach bath mats securely with double-sided non-slip rug tape. Do not have throw rugs and other things on the floor that can make you trip. What can I do in the bedroom? Use night lights. Make sure that you have a light by your bed that is easy to reach. Do not use any sheets or blankets that are too big for your bed. They should not hang down onto the floor. Have a firm chair that has side arms. You can use this for support while you get dressed. Do not have throw rugs and other things on the floor that can make you trip. What can I do in the kitchen? Clean up any spills right away. Avoid walking on wet floors. Keep items that you use a lot in easy-to-reach places. If you need to reach something above you, use a strong step stool that has a grab bar. Keep electrical cords  out of the way. Do not use floor polish or wax that makes floors slippery. If you must use wax, use non-skid floor wax. Do not have throw rugs and other things on the floor that can make you trip. What can I do with my stairs? Do not leave any items on the stairs. Make sure that there are handrails on both sides of the stairs and use them. Fix handrails that are broken or loose. Make sure that handrails are as long as the stairways. Check any carpeting to make sure that it is firmly attached to the stairs. Fix any carpet that is loose or worn. Avoid having throw rugs at the top or bottom of the stairs. If you do have throw rugs, attach them to the floor with carpet tape. Make sure that you have a light switch at the top of the stairs and the bottom of the stairs. If you do not have them, ask someone to add them for you. What else can I do to help prevent falls? Wear shoes that: Do not  have high heels. Have rubber bottoms. Are comfortable and fit you well. Are closed at the toe. Do not wear sandals. If you use a stepladder: Make sure that it is fully opened. Do not climb a closed stepladder. Make sure that both sides of the stepladder are locked into place. Ask someone to hold it for you, if possible. Clearly mark and make sure that you can see: Any grab bars or handrails. First and last steps. Where the edge of each step is. Use tools that help you move around (mobility aids) if they are needed. These include: Canes. Walkers. Scooters. Crutches. Turn on the lights when you go into a dark area. Replace any light bulbs as soon as they burn out. Set up your furniture so you have a clear path. Avoid moving your furniture around. If any of your floors are uneven, fix them. If there are any pets around you, be aware of where they are. Review your medicines with your doctor. Some medicines can make you feel dizzy. This can increase your chance of falling. Ask your doctor what other things that you can do to help prevent falls. This information is not intended to replace advice given to you by your health care provider. Make sure you discuss any questions you have with your health care provider. Document Released: 03/25/2009 Document Revised: 11/04/2015 Document Reviewed: 07/03/2014 Elsevier Interactive Patient Education  2017 [employer].

## 2022-07-28 NOTE — Telephone Encounter (Signed)
Unable to reach patient for scheduled AWV. No answer. Phone goes straight to voicemail. Left message to call this office back. Okay to reschedule.

## 2022-07-28 NOTE — Progress Notes (Cosign Needed Addendum)
Subjective:   Terri Andrade is a 72 y.o. female who presents for Medicare Annual (Subsequent) preventive examination.  Review of Systems    No ROS.  Medicare Wellness Virtual Visit.  Visual/audio telehealth visit, UTA vital signs.   See social history for additional risk factors.         Objective:    Today's Vitals   07/28/22 1329  Weight: 162 lb (73.5 kg)  Height: 5' 3"$  (1.6 m)   Body mass index is 28.7 kg/m.     07/28/2022    1:09 PM 07/18/2021    2:56 PM 07/07/2020   12:55 PM  Advanced Directives  Does Patient Have a Medical Advance Directive? Yes Yes Yes  Type of Paramedic of [place];Living will Healthcare Power of [place];Living will  Does patient want to make changes to medical advance directive? No - Patient declined No - Patient declined No - Patient declined  Copy of [place] in Chart? No - copy requested No - copy requested No - copy requested    Current Medications (verified) Outpatient Encounter Medications as of 07/28/2022  Medication Sig   albuterol (VENTOLIN HFA) 108 (90 Base) MCG/ACT inhaler Inhale 1-2 puffs into the lungs every 6 (six) hours as needed (cough).   aspirin 81 MG EC tablet Take by mouth.   Multiple Vitamin (MULTI-VITAMIN) tablet Take 1 tablet by mouth daily.   omeprazole (PRILOSEC) 40 MG capsule TAKE 1 CAPSULE BY MOUTH EVERY DAY   triamcinolone ointment (KENALOG) 0.1 % Apply 1 application topically 2 (two) times daily.   No facility-administered encounter medications on file as of 07/28/2022.    Allergies (verified) Patient has no known allergies.   History: Past Medical History:  Diagnosis Date   Chronic bronchitis ([place])    Lump or mass in breast 06/12/2012   Treadmill stress test negative for angina pectoris 06/13/2007   Past Surgical History:  Procedure Laterality Date   BREAST BIOPSY Right 12-25-2012    BENIGN BREAST TISSUE WITH FOCAL COLUMNAR CELL  CHANGE, USUAL   CHOLECYSTECTOMY  2004   SPINE SURGERY  2007   Lumbar disk,  [name]   TUBAL LIGATION     Family History  Problem Relation Age of Onset   Mental illness Mother        bipolar disorder   Cancer Father 65       tobacco related CA   Cancer Brother        tobacco related CA   Birth defects Neg Hx    Breast cancer Neg Hx    Social History   Socioeconomic History   Marital status: Married    Spouse name: Not on file   Number of children: Not on file   Years of education: Not on file   Highest education level: Not on file  Occupational History   Not on file  Tobacco Use   Smoking status: Never   Smokeless tobacco: Never  Substance and Sexual Activity   Alcohol use: Yes    Comment: social   Drug use: No   Sexual activity: Not on file  Other Topics Concern   Not on file  Social History Narrative   Not on file   Social Determinants of Health   Financial Resource Strain: Low Risk  (07/28/2022)   Overall Financial Resource Strain (CARDIA)    Difficulty of Paying Living Expenses: Not hard at all  Food Insecurity: No Food Insecurity (07/28/2022)  Hunger Vital Sign    Worried About Running Out of Food in the Last Year: Never true    Ran Out of Food in the Last Year: Never true  Transportation Needs: No Transportation Needs (07/28/2022)   PRAPARE - Hydrologist (Medical): No    Lack of Transportation (Non-Medical): No  Physical Activity: Unknown (07/07/2020)   Exercise Vital Sign    Days of Exercise per Week: 0 days    Minutes of Exercise per Session: Not on file  Stress: No Stress Concern Present (07/28/2022)   [place]    Feeling of Stress : Not at all  Social Connections: Unknown (07/28/2022)   Social Connection and Isolation Panel [NHANES]    Frequency of Communication with Friends and Family: More than three times a week    Frequency of Social Gatherings with  Friends and Family: More than three times a week    Attends Religious Services: Not on Advertising copywriter or Organizations: Not on file    Attends Archivist Meetings: Not on file    Marital Status: Married    Tobacco Counseling Counseling given: Not Answered   Clinical Intake:  Pre-visit preparation completed: Yes        Diabetes: No  How often do you need to have someone help you when you read instructions, pamphlets, or other written materials from your doctor or pharmacy?: 1 - Never    Interpreter Needed?: No      Activities of Daily Living    07/28/2022    1:12 PM  In your present state of health, do you have any difficulty performing the following activities:  Hearing? 0  Vision? 0  Difficulty concentrating or making decisions? 0  Walking or climbing stairs? 0  Dressing or bathing? 0  Doing errands, shopping? 0  Preparing Food and eating ? N  Using the Toilet? N  In the past six months, have you accidently leaked urine? N  Do you have problems with loss of bowel control? N  Managing your Medications? N  Managing your Finances? N  Housekeeping or managing your Housekeeping? N    Patient Care Team: Crecencio Mc, MD as PCP - General (Internal Medicine) Bary Castilla [place] Gleason, MD as Consulting Physician (General Surgery) Crecencio Mc, MD (Internal Medicine)  Indicate any recent Medical Services you may have received from other than [facility] providers in the past year (date may be approximate).     Assessment:   This is a routine wellness examination for [name].  I connected with  Bridgette Habermann on 07/28/22 by a audio enabled telemedicine application and verified that I am speaking with the correct person using two identifiers.  Patient Location: Home  Provider Location: Office/Clinic  I discussed the limitations of evaluation and management by telemedicine. The patient expressed understanding and agreed to proceed.    Hearing/Vision screen Hearing Screening - Comments:: Patient is able to hear conversational tones without difficulty.  No issues reported.   Vision Screening - Comments:: Followed by [facility] Wears corrective lenses They have seen their ophthalmologist in the last 12 months.    Dietary issues and exercise activities discussed:   Regular diet   Goals Addressed               This Visit's Progress     Patient Stated     I would like to lose about 40lbs (  pt-stated)        Weight watchers. Walk more for exercise.       Depression Screen    07/28/2022    1:10 PM 07/24/2022    8:03 AM 07/21/2021    9:08 AM 07/18/2021    2:55 PM 07/20/2020    3:03 PM 07/07/2020   12:42 PM 02/03/2019    8:54 AM  PHQ 2/9 Scores  PHQ - 2 Score 0 0 0 0 0 0 0  PHQ- 9 Score 0 3     0    Fall Risk    07/28/2022    1:12 PM 07/24/2022    8:03 AM 07/21/2021    9:08 AM 07/18/2021    2:57 PM 07/20/2020    3:03 PM  Fall Risk   Falls in the past year? 0 0 0 0 0  Number falls in past yr: 0 0  0 0  Injury with Fall? 0 0   0  Risk for fall due to :  No Fall Risks No Fall Risks    Follow up Falls evaluation completed;Falls prevention discussed Falls evaluation completed Falls evaluation completed Falls evaluation completed Falls evaluation completed    FALL RISK PREVENTION PERTAINING TO THE HOME: Home free of loose throw rugs in walkways, pet beds, electrical cords, etc? Yes  Adequate lighting in your home to reduce risk of falls? Yes   ASSISTIVE DEVICES UTILIZED TO PREVENT FALLS: Life alert? No  Use of a cane, walker or w/c? No   TIMED UP AND GO: Was the test performed? No .   Cognitive Function:        07/28/2022    1:13 PM 07/18/2021    3:23 PM  6CIT Screen  What Year? 0 points 0 points  What month? 0 points 0 points  What time? 0 points 0 points  Count back from 20 0 points   Months in reverse 0 points 0 points  Repeat phrase 0 points   Total Score 0 points      Immunizations Immunization History  Administered Date(s) Administered   Fluad Quad(high Dose 65+) 02/20/2019, 03/26/2021, 05/01/2022   Influenza Split 04/01/2014, 03/20/2018   Influenza, High Dose Seasonal PF 03/04/2020   Influenza,inj,Quad PF,6+ Mos 03/27/2017   Influenza-Unspecified 02/27/2013, 03/10/2015, 03/08/2020   PFIZER(Purple Top)SARS-COV-2 Vaccination 08/14/2019, 09/04/2019   Pneumococcal Conjugate-13 05/13/2015   Pneumococcal Polysaccharide-23 04/10/2014, 03/24/2019   Tdap 03/04/2013   Zoster Recombinat (Shingrix) 05/27/2019, 08/04/2019   Zoster, Live 04/07/2012   Screening Tests Health Maintenance  Topic Date Due   DEXA SCAN  Never done   Fecal DNA (Cologuard)  06/29/2022   DTaP/Tdap/Td (2 - Td or Tdap) 03/05/2023   Medicare Annual Wellness (AWV)  07/29/2023   MAMMOGRAM  09/14/2023   Pneumonia Vaccine 7+ Years old  Completed   INFLUENZA VACCINE  Completed   Hepatitis C Screening  Completed   Zoster Vaccines- Shingrix  Completed   HPV VACCINES  Aged Out   COLONOSCOPY (Pts 45-18yr Insurance coverage will need to be confirmed)  Discontinued   COVID-19 Vaccine  Discontinued    Health Maintenance Health Maintenance Due  Topic Date Due   DEXA SCAN  Never done   Fecal DNA (Cologuard)  06/29/2022   Bone density- scheduled Mammogram- scheduled   Cologuard- received today.   Lung Cancer Screening: (Low Dose CT Chest recommended if Age 70-80years, 30 pack-year currently smoking OR have quit w/in 15years.) does not qualify.   Hepatitis C Screening: Completed 2016.  Vision  Screening: Recommended annual ophthalmology exams for early detection of glaucoma and other disorders of the eye.  Dental Screening: Recommended annual dental exams for proper oral hygiene.  Community Resource Referral / Chronic Care Management: CRR required this visit?  No   CCM required this visit?  No      Plan:     I have personally reviewed and noted the following in the  patient's chart:   Medical and social history Use of alcohol, tobacco or illicit drugs  Current medications and supplements including opioid prescriptions. Patient is not currently taking opioid prescriptions. Functional ability and status Nutritional status Physical activity Advanced directives List of other physicians Hospitalizations, surgeries, and ER visits in previous 12 months Vitals Screenings to include cognitive, depression, and falls Referrals and appointments  In addition, I have reviewed and discussed with patient certain preventive protocols, quality metrics, and best practice recommendations. A written personalized care plan for preventive services as well as general preventive health recommendations were provided to patient.     [place], LPN   [id number]    I have reviewed the above information and agree with above.   Deborra Medina, MD

## 2022-08-16 LAB — COLOGUARD: COLOGUARD: NEGATIVE

## 2022-08-30 ENCOUNTER — Other Ambulatory Visit: Payer: Self-pay | Admitting: Internal Medicine

## 2022-08-30 MED ORDER — ONDANSETRON 4 MG PO TBDP: 4.0000 mg | ORAL_TABLET | Freq: Three times a day (TID) | ORAL | 0 refills | Status: DC | PRN

## 2022-09-21 ENCOUNTER — Other Ambulatory Visit: Payer: Self-pay | Admitting: Internal Medicine

## 2022-10-05 ENCOUNTER — Ambulatory Visit
Admission: RE | Admit: 2022-10-05 | Discharge: 2022-10-05 | Disposition: A | Discharge status: Home / Self Care | Payer: Medicare Other | Source: Ambulatory Visit | Attending: Internal Medicine | Admitting: Internal Medicine

## 2022-10-05 DIAGNOSIS — Z1231 Encounter for screening mammogram for malignant neoplasm of breast: Secondary | ICD-10-CM | POA: Diagnosis not present

## 2022-10-05 DIAGNOSIS — Z780 Asymptomatic menopausal state: Secondary | ICD-10-CM | POA: Insufficient documentation

## 2023-07-30 ENCOUNTER — Ambulatory Visit: Payer: Medicare Other | Admitting: Internal Medicine

## 2023-08-02 ENCOUNTER — Encounter: Payer: Medicare Other | Admitting: *Deleted

## 2023-08-06 ENCOUNTER — Ambulatory Visit (INDEPENDENT_AMBULATORY_CARE_PROVIDER_SITE_OTHER): Payer: Medicare Other | Admitting: Internal Medicine

## 2023-08-06 ENCOUNTER — Ambulatory Visit: Payer: Medicare Other

## 2023-08-06 VITALS — BP 128/88 | HR 85 | Ht 63.0 in | Wt 161.8 lb

## 2023-08-06 DIAGNOSIS — E785 Hyperlipidemia, unspecified: Secondary | ICD-10-CM | POA: Diagnosis not present

## 2023-08-06 DIAGNOSIS — E663 Overweight: Secondary | ICD-10-CM | POA: Diagnosis not present

## 2023-08-06 DIAGNOSIS — R0602 Shortness of breath: Secondary | ICD-10-CM | POA: Insufficient documentation

## 2023-08-06 DIAGNOSIS — R0609 Other forms of dyspnea: Secondary | ICD-10-CM | POA: Insufficient documentation

## 2023-08-06 DIAGNOSIS — R5383 Other fatigue: Secondary | ICD-10-CM

## 2023-08-06 DIAGNOSIS — R079 Chest pain, unspecified: Secondary | ICD-10-CM | POA: Insufficient documentation

## 2023-08-06 DIAGNOSIS — Z1211 Encounter for screening for malignant neoplasm of colon: Secondary | ICD-10-CM

## 2023-08-06 DIAGNOSIS — R7301 Impaired fasting glucose: Secondary | ICD-10-CM

## 2023-08-06 DIAGNOSIS — Z1231 Encounter for screening mammogram for malignant neoplasm of breast: Secondary | ICD-10-CM

## 2023-08-06 LAB — TSH: TSH: 0.69 u[IU]/mL (ref 0.35–5.50)

## 2023-08-06 LAB — HEMOGLOBIN A1C: Hgb A1c MFr Bld: 5.7 % (ref 4.6–6.5)

## 2023-08-06 LAB — TROPONIN I (HIGH SENSITIVITY): High Sens Troponin I: 5 ng/L (ref 2–17)

## 2023-08-06 LAB — CK TOTAL AND CKMB (NOT AT ARMC)
CK, MB: <0.7 ng/mL (ref 0–5.0)
Total CK: 83 U/L (ref 18–225)

## 2023-08-06 MED ORDER — OMEPRAZOLE 40 MG PO CPDR: DELAYED_RELEASE_CAPSULE | ORAL | 1 refills | Status: DC

## 2023-08-06 NOTE — Patient Instructions (Addendum)
 YOUR MAMMOGRAM IS DUE October 05, 2023, PLEASE CALL AND GET THIS SCHEDULED! [facility] - call [phone]   I am referring you to Dr Cristal Deer [facility] Card[facility] to have your heart examined in more detail.    He will probably repeat your EKG and run an ECHO (ultrasound of your heart)  He may decide to put you through a stress test ,and/or  a coronary calcium CT

## 2023-08-06 NOTE — Progress Notes (Unsigned)
 Patient ID: Terri Andrade, female    DOB: [birth year]  Age: 73 y.o. MRN: [id number]  The patient is here for follow up and  management of other chronic and acute problems.   The risk factors are reflected in the social history.   The roster of all physicians providing medical care to patient - is listed in the Snapshot section of the chart.   Activities of daily living:  The patient is 100% independent in all ADLs: dressing, toileting, feeding as well as independent mobility   Home safety : The patient has smoke detectors in the home. They wear seatbelts.  There are no unsecured firearms at home. There is no violence in the home.    There is no risks for hepatitis, STDs or HIV. There is no   history of blood transfusion. They have no travel history to infectious disease endemic areas of the world.   The patient has seen their dentist in the last six month. They have seen their eye doctor in the last year. The patinet  denies slight hearing difficulty with regard to whispered voices and some television programs.  They have deferred audiologic testing in the last year.  They do not  have excessive sun exposure. Discussed the need for sun protection: hats, long sleeves and use of sunscreen if there is significant sun exposure.    Diet: the importance of a healthy diet is discussed. They do have a healthy diet.   The benefits of regular aerobic exercise were discussed. The patient  walks for exercise  3 to 5 days per week  for  60 minutes.   t Depression screen: there are no signs or vegative symptoms of depression- irritability, change in appetite, anhedonia, sadness/tearfullness.   The following portions of the patient's history were reviewed and updated as appropriate: allergies, current medications, past family history, past medical history,  past surgical history, past social history  and problem list.   Visual acuity was not assessed per patient preference since the patient has regular follow  up with an  ophthalmologist. Hearing and body mass index were assessed and reviewed.    During the course of the visit the patient was educated and counseled about appropriate screening and preventive services including : fall prevention , diabetes screening, nutrition counseling, colorectal cancer screening, and recommended immunizations.    Chief Complaint:    New onset Short of breath with activities  such as changing the bed sheets, carrying christmas decorations to garage,  but not with leisurely walking.    Has h/o  asthma but PFTs were normal  in 2022 and t has not used MDI in ages..  no recent evaluation by [facility] pulmonology    Review of Symptoms  Patient denies headache, fevers, malaise, unintentional weight loss, skin rash, eye pain, sinus congestion and sinus pain, sore throat, dysphagia,  hemoptysis , cough, , wheezing, chest pain,   palpitations, orthopnea, edema, abdominal pain, nausea, melena, diarrhea, constipation, flank pain, dysuria, hematuria, urinary  Frequency, nocturia, numbness, tingling, seizures,  Focal weakness, Loss of consciousness,  Tremor, insomnia, depression, anxiety, and suicidal ideation.    Physical Exam:  BP 128/88   Pulse 85   Ht 5\' 3"  (1.6 m)   Wt 161 lb 12.8 oz (73.4 kg)   SpO2 95%   BMI 28.66 kg/m    Physical Exam Vitals reviewed.  Constitutional:      General: She is not in acute distress.    Appearance: Normal appearance. She is normal  weight. She is not ill-appearing, toxic-appearing or diaphoretic.  HENT:     Head: Normocephalic.  Eyes:     General: No scleral icterus.       Right eye: No discharge.        Left eye: No discharge.     Conjunctiva/sclera: Conjunctivae normal.  Cardiovascular:     Rate and Rhythm: Normal rate and regular rhythm.     Heart sounds: Normal heart sounds.  Pulmonary:     Effort: Pulmonary effort is normal. No respiratory distress.     Breath sounds: Normal breath sounds.  Musculoskeletal:        General:  Normal range of motion.  Skin:    General: Skin is warm and dry.  Neurological:     General: No focal deficit present.     Mental Status: She is alert and oriented to person, place, and time. Mental status is at baseline.  Psychiatric:        Mood and Affect: Mood normal.        Behavior: Behavior normal.        Thought Content: Thought content normal.        Judgment: Judgment normal.    Assessment and Plan: Overweight (BMI 25.0-29.9) -     Comprehensive metabolic panel  Hyperlipidemia LDL goal <160 Assessment & Plan: Improved with dietary changes.  10 yr risk is 10% due to age. Will consider statin advice pending cardiology evaluation .   Lab Results  Component Value Date   CHOL 187 08/06/2023   HDL 46.40 08/06/2023   LDLCALC 110 (H) 08/06/2023   LDLDIRECT 139.0 08/06/2023   TRIG 154.0 (H) 08/06/2023   CHOLHDL 4 08/06/2023     Orders: -     Lipid panel -     LDL cholesterol, direct -     TSH  Encounter for screening mammogram for malignant neoplasm of breast -     3D Screening Mammogram, Left and Right; Future  Impaired fasting glucose -     Hemoglobin A1c  Fatigue, unspecified type -     CBC with Differential/Platelet  Chest pain at rest Assessment & Plan: DDX includes esophageal spasm.relux and new onset angina. I have ordered and reviewed a 12 lead EKG and find that there are no acute changes and patient is in sinus rhythm.  Troponin I, CKMB, chest x ray ordered.   Referring to [place] cardiology  for risk stratification  Orders: -     Ambulatory referral to Cardiology -     CK total and CKMB (cardiac)not at [facility] -     EKG 12-Lead -     Troponin I (High Sensitivity)  Dyspnea on exertion Assessment & Plan: She denies cough with exertion and despite a diagnosis of asthma , had normal PFTS  in Nov 2022 with a methacholine challenge in Dec 2022 that was not reported in [facility] records .  I have ordered and reviewed a 12 lead EKG and find that there are no acute  changes and patient is in sinus rhythm.   . Referring to cardiology for risk stratification   Orders: -     Ambulatory referral to Cardiology -     DG Chest 2 View; Future  Screening for colon cancer Assessment & Plan: Continue use of Cologurad every 3 years ; next one is due in 2027   Other orders -     Omeprazole; TAKE 1 CAPSULE BY MOUTH EVERY DAY  Dispense: 90 capsule; Refill: 1  I provided  30 minutes  during this encounter reviewing patient's  past cardiac and pulmonary evaluations  previous labs and imaging studies, providing counseling on the above mentioned problems I n  a face to face visit  , and coordination  of care .   No follow-ups on file.  Sherlene Shams, MD

## 2023-08-06 NOTE — Assessment & Plan Note (Signed)
 I have ordered and reviewed a 12 lead EKG and find that there are no acute changes and patient is in sinus rhythm.  Troponin I, CKMB, chest x ray ordered.   Referring to [place] cardiology  for risk stratification

## 2023-08-06 NOTE — Assessment & Plan Note (Signed)
 She denies cough with exertion and despite a diagnosis of asthma , had normal PFTS  in Nov 2022 with a methacholine challenge in Dec 2022 that was not reported in [facility] records .  I have ordered and reviewed a 12 lead EKG and find that there are no acute changes and patient is in sinus rhythm.   . Referring to cardiology for risk stratification

## 2023-08-06 NOTE — Assessment & Plan Note (Signed)
 Continue use of Cologurad every 3 years ; next one is due in 2027

## 2023-08-07 ENCOUNTER — Encounter: Payer: Self-pay | Admitting: Internal Medicine

## 2023-08-07 LAB — CBC WITH DIFFERENTIAL/PLATELET
Basophils Absolute: 0.1 10*3/uL (ref 0.0–0.1)
Basophils Relative: 0.6 % (ref 0.0–3.0)
Eosinophils Absolute: 0.1 10*3/uL (ref 0.0–0.7)
Eosinophils Relative: 1.5 % (ref 0.0–5.0)
HCT: 44.5 % (ref 36.0–46.0)
Hemoglobin: 15 g/dL (ref 12.0–15.0)
Lymphocytes Relative: 25.8 % (ref 12.0–46.0)
Lymphs Abs: 2.5 10*3/uL (ref 0.7–4.0)
MCHC: 33.7 g/dL (ref 30.0–36.0)
MCV: 100.2 fL — ABNORMAL HIGH (ref 78.0–100.0)
Monocytes Absolute: 0.6 10*3/uL (ref 0.1–1.0)
Monocytes Relative: 6.6 % (ref 3.0–12.0)
Neutro Abs: 6.2 10*3/uL (ref 1.4–7.7)
Neutrophils Relative %: 65.5 % (ref 43.0–77.0)
Platelets: 377 10*3/uL (ref 150.0–400.0)
RBC: 4.44 Mil/uL (ref 3.87–5.11)
RDW: 12.1 % (ref 11.5–15.5)
WBC: 9.5 10*3/uL (ref 4.0–10.5)

## 2023-08-07 LAB — COMPREHENSIVE METABOLIC PANEL
ALT: 13 U/L (ref 0–35)
AST: 17 U/L (ref 0–37)
Albumin: 4.3 g/dL (ref 3.5–5.2)
Alkaline Phosphatase: 50 U/L (ref 39–117)
BUN: 15 mg/dL (ref 6–23)
CO2: 25 meq/L (ref 19–32)
Calcium: 9.4 mg/dL (ref 8.4–10.5)
Chloride: 100 meq/L (ref 96–112)
Creatinine, Ser: 0.94 mg/dL (ref 0.40–1.20)
GFR: 61.21 mL/min (ref >60.00)
Glucose, Bld: 87 mg/dL (ref 70–99)
Potassium: 4 meq/L (ref 3.5–5.1)
Sodium: 138 meq/L (ref 135–145)
Total Bilirubin: 0.5 mg/dL (ref 0.2–1.2)
Total Protein: 8 g/dL (ref 6.0–8.3)

## 2023-08-07 LAB — LIPID PANEL
Cholesterol: 187 mg/dL (ref 0–200)
HDL: 46.4 mg/dL (ref >39.00)
LDL Cholesterol: 110 mg/dL — ABNORMAL HIGH (ref 0–99)
NonHDL: 140.65
Total CHOL/HDL Ratio: 4
Triglycerides: 154 mg/dL — ABNORMAL HIGH (ref 0.0–149.0)
VLDL: 30.8 mg/dL (ref 0.0–40.0)

## 2023-08-07 LAB — LDL CHOLESTEROL, DIRECT: Direct LDL: 139 mg/dL

## 2023-08-07 NOTE — Assessment & Plan Note (Signed)
 Improved with dietary changes.  10 yr risk is 10% due to age. Will consider statin advice pending cardiology evaluation .   Lab Results  Component Value Date   CHOL 187 08/06/2023   HDL 46.40 08/06/2023   LDLCALC 110 (H) 08/06/2023   LDLDIRECT 139.0 08/06/2023   TRIG 154.0 (H) 08/06/2023   CHOLHDL 4 08/06/2023

## 2023-08-10 ENCOUNTER — Telehealth: Payer: Self-pay

## 2023-08-10 NOTE — Telephone Encounter (Signed)
 Copied from CRM [id number]. Topic: Appointments - Appointment Info/Confirmation >> Aug 10, 2023 12:14 PM Truddie Crumble wrote: Patient/patient representative is calling for information regarding an appointment. Patient called stating she never received her medicare wellness call that was supposed to take place on 2/28

## 2023-08-17 ENCOUNTER — Encounter: Payer: Self-pay | Admitting: Internal Medicine

## 2023-09-26 ENCOUNTER — Encounter: Payer: Self-pay | Admitting: Internal Medicine

## 2023-09-26 ENCOUNTER — Ambulatory Visit: Attending: Internal Medicine | Admitting: Internal Medicine

## 2023-09-26 VITALS — BP 136/86 | HR 87 | Ht 63.0 in | Wt 162.0 lb

## 2023-09-26 DIAGNOSIS — R002 Palpitations: Secondary | ICD-10-CM | POA: Diagnosis present

## 2023-09-26 DIAGNOSIS — R0602 Shortness of breath: Secondary | ICD-10-CM | POA: Insufficient documentation

## 2023-09-26 DIAGNOSIS — Z79899 Other long term (current) drug therapy: Secondary | ICD-10-CM | POA: Diagnosis present

## 2023-09-26 DIAGNOSIS — R072 Precordial pain: Secondary | ICD-10-CM | POA: Diagnosis not present

## 2023-09-26 MED ORDER — METOPROLOL TARTRATE 100 MG PO TABS: 100.0000 mg | ORAL_TABLET | Freq: Once | ORAL | 0 refills | Status: DC

## 2023-09-26 NOTE — Progress Notes (Signed)
  Cardiology Office Note:  .   Date:  09/26/2023  ID:  Terri Andrade, DOB 17-May-1951, MRN [id number] PCP: Thersia Flax, MD  [facility] Providers Cardiologist:  None     History of Present Illness: .   Terri Andrade is a 73 y.o. female with history of asthma, chronic bronchitis, who has been referred for evaluation of dyspnea on exertion by Dr. Madelon Scheuermann.  She mention this at her wellness visit in February.  EKG was normal at that time.  Terri Andrade believes that her shortness of breath has been present off and on for at least 6 months, though she notes her husband would say it has actually been longer than that.  She reports episodic dyspnea that can happen randomly, both at rest and with activity.  The dyspnea can last for hours at a time and is occasionally accompanied by a sharp pain in her chest lasting 15 to 20 minutes.  This is often associated with palpitations as well.  Episodes happen a few times a week on average, most recently this morning.  She denies any prior heart problems.  She underwent a stress test many years ago and believes it was normal.  Terri Andrade notes occasional mild ankle edema but no orthopnea or PND.  ROS: See HPI  Studies Reviewed: Aaron Aas   EKG Interpretation Date/Time:  Wednesday September 26 2023 11:05:35 EDT Ventricular Rate:  87 PR Interval:  138 QRS Duration:  66 QT Interval:  356 QTC Calculation: 428 R Axis:   -11  Text Interpretation: Normal sinus rhythm Low voltage QRS Borderline ECG When compared with ECG of 06-Aug-2023 QRS voltage has decreased Otherwise no significant change Confirmed by Ciaira Natividad, Veryl Gottron [phone]) on 09/26/2023 2:09:03 PM    Risk Assessment/Calculations:             Physical Exam:   VS:  BP 136/86   Pulse 87   Ht 5\' 3"  (1.6 m)   Wt 162 lb (73.5 kg)   SpO2 95%   BMI 28.70 kg/m    Wt Readings from Last 3 Encounters:  09/26/23 162 lb (73.5 kg)  08/06/23 161 lb 12.8 oz (73.4 kg)  07/28/22 162 lb (73.5 kg)    General:  NAD. Neck:  No JVD or HJR. Lungs: Clear to auscultation bilaterally without wheezes or crackles. Heart: Regular rate and rhythm without murmurs, rubs, or gallops. Abdomen: Soft, nontender, nondistended. Extremities: No lower extremity edema.  ASSESSMENT AND PLAN: .    Precordial pain, dyspnea, and palpitations: Terri Andrade presents primarily out of concerns for dyspnea over the last 6+ months, often associated with chest pain and palpitations.  Physical exam and EKG today are unremarkable other than borderline low voltage on today's tracing.  She reports a remote stress test that she believes was normal.  Chest radiograph in February was also unremarkable.  We have discussed further evaluation options and have agreed to obtain an echocardiogram and coronary CTA.  If this workup is unrevealing, we will need to consider ambulatory cardiac monitoring to exclude paroxysmal arrhythmia driving her symptoms.  We will defer medication changes at this time.     Dispo: Return to clinic in 1 month.  Signed, Sammy Crisp, MD

## 2023-09-26 NOTE — Patient Instructions (Signed)
 Medication Instructions:   Your Physician recommend you continue on your current medication as directed.     Metoprolol Tartrate 100 MG once 2 hours prior to Cardiac CT.  *If you need a refill on your cardiac medications before your next appointment, please call your pharmacy*  Lab Work:  Your provider would like for you to have following labs drawn today BMP.    If you have labs (blood work) drawn today and your tests are completely normal, you will receive your results only by: MyChart Message (if you have MyChart) OR A paper copy in the mail If you have any lab test that is abnormal or we need to change your treatment, we will call you to review the results.  Testing/Procedures: Your physician has requested that you have an echocardiogram. Echocardiography is a painless test that uses sound waves to create images of your heart. It provides your doctor with information about the size and shape of your heart and how well your heart's chambers and valves are working.   You may receive an ultrasound enhancing agent through an IV if needed to better visualize your heart during the echo. This procedure takes approximately one hour.  There are no restrictions for this procedure.  This will take place at [address] [facility] [address] [facility]) [address], [place] [address]  Please note: We ask at that you not bring children with you during ultrasound (echo/ vascular) testing. Due to room size and safety concerns, children are not allowed in the ultrasound rooms during exams. Our front office staff cannot provide observation of children in our lobby area while testing is being conducted. An adult accompanying a patient to their appointment will only be allowed in the ultrasound room at the discretion of the ultrasound technician under special circumstances. We apologize for any inconvenience. .    Your cardiac CT will be scheduled at one of the below locations:    [facility] [address] [place], [place] [address] [phone]  OR   [facility] [address] [place], [place] [address] [phone]   If scheduled at [facility] or [facility], please arrive 15 mins early for check-in and test prep.  There is spacious parking and easy access to the radiology department from the [facility] entrance. Please enter here and check-in with the desk attendant.    Please follow these instructions carefully (unless otherwise directed):  An IV will be required for this test and Nitroglycerin will be given.    On the Night Before the Test: Be sure to Drink plenty of water. Do not consume any caffeinated/decaffeinated beverages or chocolate 12 hours prior to your test. Do not take any antihistamines 12 hours prior to your test.  On the Day of the Test: Drink plenty of water until 1 hour prior to the test. Do not eat any food 1 hour prior to test. You may take your regular medications prior to the test.  Take metoprolol (Lopressor) two hours prior to test. FEMALES- please wear underwire-free bra if available, avoid dresses & tight clothing   After the Test: Drink plenty of water. After receiving IV contrast, you may experience a mild flushed feeling. This is normal. On occasion, you may experience a mild rash up to 24 hours after the test. This is not dangerous. If this occurs, you can take Benadryl 25 mg, Zyrtec, Claritin, or Allegra and increase your fluid  intake. (Patients taking Tikosyn should avoid Benadryl, and may take Zyrtec, Claritin, or Allegra) If you experience trouble breathing, this can be serious. If it is severe call 911 IMMEDIATELY. If it is mild, please call our office.  We will call to schedule your test 2-4 weeks out understanding that some insurance companies will need an authorization prior to the service being  performed.   For more information and frequently asked questions, please visit our website : [email or web address]  For non-scheduling related questions, please contact the cardiac imaging nurse navigator should you have any questions/concerns: Cardiac Imaging Nurse Navigators Direct Office Dial: [phone]   For scheduling needs, including cancellations and rescheduling, please call [place], [phone].   Follow-Up: At [facility], you and your health needs are our priority.  As part of our continuing mission to provide you with exceptional heart care, our providers are all part of one team.  This team includes your primary Cardiologist (physician) and Advanced Practice Providers or APPs (Physician Assistants and Nurse Practitioners) who all work together to provide you with the care you need, when you need it.  Your next appointment:   1 month(s)  Provider:   You may see Dr. Nolan Battle or one of the following Advanced Practice Providers on your designated Care Team:   Laneta Pintos, NP Gildardo Labrador, PA-C Varney Gentleman, PA-C Cadence Campo Bonito, PA-C Ronald Cockayne, NP Morey Ar, NP    We recommend signing up for the patient portal called "MyChart".  Sign up information is provided on this After Visit Summary.  MyChart is used to connect with patients for Virtual Visits (Telemedicine).  Patients are able to view lab/test results, encounter notes, upcoming appointments, etc.  Non-urgent messages can be sent to your provider as well.   To learn more about what you can do with MyChart, go to [email or web address].

## 2023-09-27 LAB — BASIC METABOLIC PANEL WITH GFR
BUN/Creatinine Ratio: 23 (ref 12–28)
BUN: 20 mg/dL (ref 8–27)
CO2: 24 mmol/L (ref 20–29)
Calcium: 10.2 mg/dL (ref 8.7–10.3)
Chloride: 98 mmol/L (ref 96–106)
Creatinine, Ser: 0.87 mg/dL (ref 0.57–1.00)
Glucose: 93 mg/dL (ref 70–99)
Potassium: 4.7 mmol/L (ref 3.5–5.2)
Sodium: 137 mmol/L (ref 134–144)
eGFR: 71 mL/min/{1.73_m2} (ref >59)

## 2023-10-18 ENCOUNTER — Encounter (HOSPITAL_COMMUNITY): Payer: Self-pay

## 2023-10-22 ENCOUNTER — Ambulatory Visit
Admission: RE | Admit: 2023-10-22 | Discharge: 2023-10-22 | Disposition: A | Discharge status: Home / Self Care | Source: Ambulatory Visit | Attending: Internal Medicine | Admitting: Internal Medicine

## 2023-10-22 DIAGNOSIS — R072 Precordial pain: Secondary | ICD-10-CM | POA: Diagnosis present

## 2023-10-22 DIAGNOSIS — R0602 Shortness of breath: Secondary | ICD-10-CM | POA: Insufficient documentation

## 2023-10-22 MED ORDER — METOPROLOL TARTRATE 5 MG/5ML IV SOLN
10.0000 mg | Freq: Once | INTRAVENOUS | Status: DC | PRN
  Filled 2023-10-22: qty 10

## 2023-10-22 MED ORDER — IOHEXOL 350 MG/ML SOLN
80.0000 mL | Freq: Once | INTRAVENOUS | Status: AC | PRN
  Administered 2023-10-22: 80 mL via INTRAVENOUS

## 2023-10-22 MED ORDER — NITROGLYCERIN 0.4 MG SL SUBL
0.8000 mg | SUBLINGUAL_TABLET | Freq: Once | SUBLINGUAL | Status: AC
  Administered 2023-10-22: 0.8 mg via SUBLINGUAL
  Filled 2023-10-22: qty 25

## 2023-10-22 MED ORDER — DILTIAZEM HCL 25 MG/5ML IV SOLN
10.0000 mg | INTRAVENOUS | Status: DC | PRN
  Filled 2023-10-22: qty 5

## 2023-10-22 NOTE — Progress Notes (Signed)
Patient tolerated procedure well. W/C to lobby.  Ambulate w/o difficulty. Denies light headedness or being dizzy. Encouraged to drink extra water today and reasoning explained. Verbalized understanding. All questions answered. ABC intact. No further needs. Discharge from procedure area w/o issues.

## 2023-10-23 ENCOUNTER — Ambulatory Visit: Payer: Self-pay | Admitting: Internal Medicine

## 2023-10-29 ENCOUNTER — Ambulatory Visit: Attending: Internal Medicine

## 2023-10-29 DIAGNOSIS — R0602 Shortness of breath: Secondary | ICD-10-CM | POA: Insufficient documentation

## 2023-10-29 DIAGNOSIS — R072 Precordial pain: Secondary | ICD-10-CM | POA: Diagnosis not present

## 2023-10-29 LAB — ECHOCARDIOGRAM COMPLETE
AR max vel: 2.27 cm2
AV Area VTI: 2.45 cm2
AV Area mean vel: 2.17 cm2
AV Mean grad: 4 mmHg
AV Peak grad: 6.9 mmHg
Ao pk vel: 1.31 m/s
Area-P 1/2: 4.8 cm2
S' Lateral: 2.65 cm

## 2023-11-02 ENCOUNTER — Ambulatory Visit: Attending: Nurse Practitioner | Admitting: Physician Assistant

## 2023-11-02 ENCOUNTER — Encounter: Payer: Self-pay | Admitting: Nurse Practitioner

## 2023-11-02 ENCOUNTER — Ambulatory Visit

## 2023-11-02 VITALS — BP 126/80 | HR 92 | Resp 16 | Ht 63.0 in | Wt 161.0 lb

## 2023-11-02 DIAGNOSIS — I2510 Atherosclerotic heart disease of native coronary artery without angina pectoris: Secondary | ICD-10-CM | POA: Diagnosis present

## 2023-11-02 DIAGNOSIS — R002 Palpitations: Secondary | ICD-10-CM | POA: Insufficient documentation

## 2023-11-02 DIAGNOSIS — R072 Precordial pain: Secondary | ICD-10-CM | POA: Insufficient documentation

## 2023-11-02 DIAGNOSIS — E785 Hyperlipidemia, unspecified: Secondary | ICD-10-CM | POA: Diagnosis present

## 2023-11-02 DIAGNOSIS — R0602 Shortness of breath: Secondary | ICD-10-CM | POA: Diagnosis present

## 2023-11-02 MED ORDER — ROSUVASTATIN CALCIUM 5 MG PO TABS: 5.0000 mg | ORAL_TABLET | Freq: Every day | ORAL | 3 refills | Status: DC

## 2023-11-02 NOTE — Patient Instructions (Signed)
 Medication Instructions:  Your physician recommends that you continue on your current medications as directed. Please refer to the Current Medication list given to you today.  *If you need a refill on your cardiac medications before your next appointment, please call your pharmacy*  Lab Work: No labs ordered today  If you have labs (blood work) drawn today and your tests are completely normal, you will receive your results only by: MyChart Message (if you have MyChart) OR A paper copy in the mail If you have any lab test that is abnormal or we need to change your treatment, we will call you to review the results.  Testing/Procedures: No test ordered today   Follow-Up: At [facility], you and your health needs are our priority.  As part of our continuing mission to provide you with exceptional heart care, our providers are all part of one team.  This team includes your primary Cardiologist (physician) and Advanced Practice Providers or APPs (Physician Assistants and Nurse Practitioners) who all work together to provide you with the care you need, when you need it.  Your next appointment:   3 month(s)  Provider:   You may see Sammy Crisp, MD or one of the following Advanced Practice Providers on your designated Care Team:   Laneta Pintos, NP Gildardo Labrador, PA-C Varney Gentleman, PA-C Cadence Gennaro Khat, PA-C Ronald Cockayne, NP Morey Ar, NP    Other Instructions Your physician has recommended that you wear a Zio monitor.   This monitor is a medical device that records the heart's electrical activity. Doctors most often use these monitors to diagnose arrhythmias. Arrhythmias are problems with the speed or rhythm of the heartbeat. The monitor is a small device applied to your chest. You can wear one while you do your normal daily activities. While wearing this monitor if you have any symptoms to push the button and record what you felt. Once you have worn this monitor for the  period of time provider prescribed (Usually 14 days), you will return the monitor device in the postage paid box. Once it is returned they will download the data collected and provide us  with a report which the provider will then review and we will call you with those results. Important tips:  Avoid showering during the first 24 hours of wearing the monitor. Avoid excessive sweating to help maximize wear time. Do not submerge the device, no hot tubs, and no swimming pools. Keep any lotions or oils away from the patch. After 24 hours you may shower with the patch on. Take brief showers with your back facing the shower head.  Do not remove patch once it has been placed because that will interrupt data and decrease adhesive wear time. Push the button when you have any symptoms and write down what you were feeling. Once you have completed wearing your monitor, remove and place into box which has postage paid and place in your outgoing mailbox.  If for some reason you have misplaced your box then call our office and we can provide another box and/or mail it off for you.

## 2023-11-02 NOTE — Progress Notes (Signed)
 Cardiology Office Note    Date:  11/02/2023   ID:  Terri, Andrade 1951/03/23, MRN [id number]  PCP:  Terri Flax, MD  Cardiologist:  Terri Crisp, MD  Electrophysiologist:  None   Chief Complaint: Follow-up  History of Present Illness:   Terri Andrade is a 73 y.o. female with history of asthma and bronchitis who presents for follow-up on symptoms of precordial pain, dyspnea, and palpitations.  Patient was seen by and established with Dr. Nolan Andrade 09/26/2023.  She was referred for evaluation of dyspnea on exertion by her PCP.  She reported 45-month history of on and off dyspnea, although her husband thought it has been longer.  Her dyspnea occurs both at rest and with exertion, occasionally accompanied by sharp pain in her chest lasting 15 to 20 minutes.  Also associated with palpitations at times.  No medication changes were indicated at that time.  She underwent coronary CTA 10/22/2023 which showed coronary calcium score of 239, 82nd percentile for age and sex matched controls with mild nonobstructive CAD (proximal LAD stenosis 25 to 49%).  Echo 10/29/2023 showed EF 60 to 65%, no RWMA, normal RV function and size, no significant valvular abnormalities.  Patient reports ongoing intermittent episodes of chest discomfort associated with palpitations and dyspnea.  This occurs somewhat randomly, at times she feels that it is brought on with an extended period of physical activity.  However, it also occurs at rest.  These episodes typically last for 15 to 30 minutes and resolve without intervention.  Her most recent episode was earlier this week as she was laying down to go to sleep.  She denies significant dyspnea with exertion, lower extremity swelling, orthopnea, and PND.  She is without symptoms of angina and cardiac decompensation.  Labs independently reviewed: 09/26/2023-BUN 20, CR 0.87, NA 137, K4.7 08/02/2023-troponin negative, TSH within normal limits Hgb 15, HCT 44.5, A1c 5.7, normal LFTs,  TC 187, TG 154, HDL 46, LDL 110, direct LDL 139  Past Medical History:  Diagnosis Date   Asthma    Chronic bronchitis (HCC)    History of echocardiogram    a. 10/2023 Echo: EF 60-65%, no rwma, nl RV fxn.   Lump or mass in breast 06/12/2012   Non-obstructive CAD (coronary artery disease)    a. 10/2023 Cor CTA: Ca2+ = 239 (82nd %'ile). LAD 25-49p.   Treadmill stress test negative for angina pectoris 06/13/2007    Current Medications: Current Meds  Medication Sig   albuterol  (VENTOLIN  HFA) 108 (90 Base) MCG/ACT inhaler Inhale 1-2 puffs into the lungs every 6 (six) hours as needed (cough).   aspirin 81 MG EC tablet Take by mouth.   metoprolol  tartrate (LOPRESSOR ) 100 MG tablet Take 1 tablet (100 mg total) by mouth once for 1 dose. Take 2 hours prior to Cardiac CT.   Multiple Vitamin (MULTI-VITAMIN) tablet Take 1 tablet by mouth daily.   omeprazole  (PRILOSEC) 40 MG capsule TAKE 1 CAPSULE BY MOUTH EVERY DAY   rosuvastatin (CRESTOR) 5 MG tablet Take 1 tablet (5 mg total) by mouth daily.   UNABLE TO FIND PROGESTERONE NF 250 MG (CAPSULE SIZE 0) PLEASE TAKE 1 CAPSULE BY MOUTH EVERY NIGHT AT BEDTIME. MAY CAUSE DROWSINESS. KEEP AT CONTROLLED ROOM TEMP (68-77 F) & AWAY FROM CHILDREN & P    Allergies:   Patient has no known allergies.   Social History   Socioeconomic History   Marital status: Married    Spouse name: Not on file   Number  of children: 2   Years of education: Not on file   Highest education level: Bachelor's degree (e.g., BA, AB, BS)  Occupational History   Not on file  Tobacco Use   Smoking status: Never   Smokeless tobacco: Never  Vaping Use   Vaping status: Never Used  Substance and Sexual Activity   Alcohol use: Yes    Comment: Couple of drinks a month.   Drug use: No   Sexual activity: Not on file  Other Topics Concern   Not on file  Social History Narrative   Not on file   Social Drivers of Health   Financial Resource Strain: Low Risk  (08/06/2023)    Overall Financial Resource Strain (CARDIA)    Difficulty of Paying Living Expenses: Not hard at all  Food Insecurity: No Food Insecurity (08/06/2023)   Hunger Vital Sign    Worried About Running Out of Food in the Last Year: Never true    Ran Out of Food in the Last Year: Never true  Transportation Needs: No Transportation Needs (08/06/2023)   PRAPARE - Administrator, Civil Service (Medical): No    Lack of Transportation (Non-Medical): No  Physical Activity: Insufficiently Active (08/06/2023)   Exercise Vital Sign    Days of Exercise per Week: 1 day    Minutes of Exercise per Session: 10 min  Stress: No Stress Concern Present (08/06/2023)   [employer] of Occupational Health - Occupational Stress Questionnaire    Feeling of Stress : Only a little  Social Connections: Socially Integrated (08/06/2023)   Social Connection and Isolation Panel [NHANES]    Frequency of Communication with Friends and Family: More than three times a week    Frequency of Social Gatherings with Friends and Family: Once a week    Attends Religious Services: More than 4 times per year    Active Member of [employer] or Organizations: Yes    Attends Engineer, structural: More than 4 times per year    Marital Status: Married     Family History:  The patient's family history includes Cancer in her brother; Cancer (age of onset: 53) in her father; Mental illness in her mother. There is no history of Birth defects or Breast cancer.  ROS:   12-point review of systems is negative unless otherwise noted in the HPI.   EKGs/Labs/Other Studies Reviewed:    Studies reviewed were summarized above. The additional studies were reviewed today:  10/29/2023 Echo complete 1. Left ventricular ejection fraction, by estimation, is 60 to 65%. Left  ventricular ejection fraction by PLAX is 63 %. The left ventricle has  normal function. The left ventricle has no regional wall motion  abnormalities. Left  ventricular diastolic  parameters were normal.   2. Right ventricular systolic function is normal. The right ventricular  size is normal.   3. The mitral valve is normal in structure. No evidence of mitral valve  regurgitation.   4. The aortic valve is tricuspid. Aortic valve regurgitation is not  visualized.   5. The inferior vena cava is normal in size with greater than 50%  respiratory variability, suggesting right atrial pressure of 3 mmHg.   10/22/2023 coronary CTA 1. Coronary calcium score of 239. This was 82nd percentile for age and sex matched control. 2. Normal coronary origin with right dominance. 3. Proximal LAD stenosis (25-49%). 4. CAD-RADS 2. Mild non-obstructive CAD (25-49%). Consider non-atherosclerotic causes of chest pain. Consider preventive therapy and risk factor modification.  Recent Labs: 08/06/2023: ALT 13; Hemoglobin 15.0; Platelets 377.0; TSH 0.69 09/26/2023: BUN 20; Creatinine, Ser 0.87; Potassium 4.7; Sodium 137  Recent Lipid Panel    Component Value Date/Time   CHOL 187 08/06/2023 1510   TRIG 154.0 (H) 08/06/2023 1510   HDL 46.40 08/06/2023 1510   CHOLHDL 4 08/06/2023 1510   VLDL 30.8 08/06/2023 1510   LDLCALC 110 (H) 08/06/2023 1510   LDLDIRECT 139.0 08/06/2023 1510    PHYSICAL EXAM:    VS:  BP 126/80 (BP Location: Left Arm, Patient Position: Sitting, Cuff Size: Large)   Pulse 92   Resp 16   Ht 5\' 3"  (1.6 m)   Wt 161 lb (73 kg)   SpO2 92%   BMI 28.52 kg/m   BMI: Body mass index is 28.52 kg/m.  Physical Exam Vitals and nursing note reviewed.  Constitutional:      General: She is not in acute distress.    Appearance: Normal appearance.  Cardiovascular:     Rate and Rhythm: Normal rate and regular rhythm.     Heart sounds: No murmur heard. Pulmonary:     Effort: Pulmonary effort is normal. No respiratory distress.     Breath sounds: No wheezing or rales.  Musculoskeletal:     Right lower leg: No edema.     Left lower leg: No edema.   Skin:    General: Skin is warm and dry.  Neurological:     General: No focal deficit present.     Mental Status: She is alert and oriented to person, place, and time. Mental status is at baseline.  Psychiatric:        Mood and Affect: Mood normal.        Behavior: Behavior normal.     Wt Readings from Last 3 Encounters:  11/02/23 161 lb (73 kg)  09/26/23 162 lb (73.5 kg)  08/06/23 161 lb 12.8 oz (73.4 kg)     ASSESSMENT & PLAN:   Precordial pain Dyspnea Palpitations - Patient reports ongoing intermittent episodes of precordial pain associated with palpitations and dyspnea.  Coronary CTA done 10/22/2023 showed coronary calcium score of 239.  Mild nonobstructive CAD with 25 to 49% proximal LAD stenosis.  Echo done 10/29/2023 showed EF 60 to 65%, normal RV function and size, no significant valvular abnormalities.  Given ongoing symptoms, will order a 14-day ZIO monitor to assess for arrhythmia.  Non-obstructive CAD Hyperlipidemia with goal LDL < 70 - Coronary CTA done 10/22/2023 showed coronary calcium score of 239. Mild nonobstructive CAD with 25 to 49% proximal LAD stenosis. Recommend initiation of rosuvastatin 5 mg daily and continuation of ASA 81 mg daily for primary prevention.   Disposition: F/u with Dr. Nolan Andrade or an APP in 3 months.   Medication Adjustments/Labs and Tests Ordered: Current medicines are reviewed at length with the patient today.  Concerns regarding medicines are outlined above. Medication changes, Labs and Tests ordered today are summarized above and listed in the Patient Instructions accessible in Encounters.   Beather Liming, PA-C 11/02/2023 11:21 AM     [facility] [address] [place], [place] [address] [phone]

## 2023-11-08 ENCOUNTER — Ambulatory Visit
Admission: RE | Admit: 2023-11-08 | Discharge: 2023-11-08 | Disposition: A | Discharge status: Home / Self Care | Source: Ambulatory Visit | Attending: Internal Medicine | Admitting: Internal Medicine

## 2023-11-08 DIAGNOSIS — Z1231 Encounter for screening mammogram for malignant neoplasm of breast: Secondary | ICD-10-CM | POA: Insufficient documentation

## 2023-11-12 ENCOUNTER — Encounter: Payer: Self-pay | Admitting: Internal Medicine

## 2023-11-13 NOTE — Telephone Encounter (Signed)
 The breast exam was performed on 11/08/2023. I do not see that the exam has been read yet.

## 2023-11-15 ENCOUNTER — Ambulatory Visit: Payer: Medicare Other | Admitting: Internal Medicine

## 2023-12-07 ENCOUNTER — Other Ambulatory Visit: Payer: Self-pay | Admitting: Internal Medicine

## 2023-12-09 DIAGNOSIS — R002 Palpitations: Secondary | ICD-10-CM | POA: Diagnosis not present

## 2023-12-10 ENCOUNTER — Ambulatory Visit: Payer: Self-pay | Admitting: Physician Assistant

## 2024-01-16 ENCOUNTER — Ambulatory Visit: Attending: Internal Medicine | Admitting: Internal Medicine

## 2024-01-16 ENCOUNTER — Encounter: Payer: Self-pay | Admitting: Internal Medicine

## 2024-01-16 VITALS — BP 124/86 | HR 88 | Ht 63.0 in | Wt 164.6 lb

## 2024-01-16 DIAGNOSIS — I471 Supraventricular tachycardia, unspecified: Secondary | ICD-10-CM | POA: Insufficient documentation

## 2024-01-16 DIAGNOSIS — R002 Palpitations: Secondary | ICD-10-CM | POA: Insufficient documentation

## 2024-01-16 DIAGNOSIS — E785 Hyperlipidemia, unspecified: Secondary | ICD-10-CM | POA: Insufficient documentation

## 2024-01-16 DIAGNOSIS — I2510 Atherosclerotic heart disease of native coronary artery without angina pectoris: Secondary | ICD-10-CM | POA: Insufficient documentation

## 2024-01-16 DIAGNOSIS — Z79899 Other long term (current) drug therapy: Secondary | ICD-10-CM | POA: Insufficient documentation

## 2024-01-16 NOTE — Patient Instructions (Signed)
 Medication Instructions:  Your physician recommends that you continue on your current medications as directed. Please refer to the Current Medication list given to you today.    *If you need a refill on your cardiac medications before your next appointment, please call your pharmacy*  Lab Work: Your provider would like for you to have following labs drawn today Lipid, LPa.     Testing/Procedures: No test ordered today   Follow-Up: At [facility], you and your health needs are our priority.  As part of our continuing mission to provide you with exceptional heart care, our providers are all part of one team.  This team includes your primary Cardiologist (physician) and Advanced Practice Providers or APPs (Physician Assistants and Nurse Practitioners) who all work together to provide you with the care you need, when you need it.  Your next appointment:   As needed  Provider:   You may see Lonni Hanson, MD or one of the following Advanced Practice Providers on your designated Care Team:   Lonni Meager, NP Lesley Maffucci, PA-C Bernardino Bring, PA-C Cadence Troy, PA-C Tylene Lunch, NP Barnie Hila, NP

## 2024-01-16 NOTE — Progress Notes (Unsigned)
  Cardiology Office Note:  .   Date:  01/17/2024  ID:  Terri Andrade, DOB 10-13-1950, MRN [id number] PCP: Marylynn Verneita CROME, MD  [facility] Providers Cardiologist:  Lonni Hanson, MD     History of Present Illness: .   Terri Andrade is a 73 y.o. female with history of nonobstructive CAD by coronary CTA in 10/2023, asthma and chronic bronchitis, who returns for follow-up of dyspnea on exertion and palpitations.  She was last seen in our office in May by Lesley Maffucci, PA, at which time she complained of palpitations and associated chest pain.  Subsequent event monitor showed predominantly sinus rhythm with rare PAC's, occasional PVC's, and a few brief supraventricular runs.  Today, Terri Andrade reports that she feels about the same as at our prior visits.  She notes intermittent shortness of breath that can occur both at rest and with activity, which she attributes to her asthma/chronic bronchitis.  She denies chest pain and edema.  She endorses sporadic palpitations happening about once a week that can last anywhere from 1 to 10 minutes.  There are no associated symptoms.  She has transient orthostatic lightheadedness from time to time.  ROS: See HPI  Studies Reviewed: .        14-day event monitor (11/02/2023): Predominantly sinus rhythm with rare PACs and occasional PVCs. A few brief supraventricular runs were also noted, lasting up to 8 beats with a maximum rate of 185 bpm.  TTE (10/29/2023): Normal LV size and wall thickness.  LVEF 60-65% with normal diastolic function.  Normal RV size and function.  No significant valvular abnormality.  Normal CVP.  Coronary CTA (10/22/2023): 25-49% proximal LAD stenosis.  No significant LCx or RCA disease.  CAC score 239 (82 percentile for age and sex matched controls).  Mildly nodular contour of left hepatic lobe noted, thought to be due to involution of previously seen hepatic cyst.  Risk Assessment/Calculations:             Physical Exam:   VS:  BP  124/86   Pulse 88   Ht 5' 3 (1.6 m)   Wt 164 lb 9.6 oz (74.7 kg)   SpO2 96%   BMI 29.16 kg/m    Wt Readings from Last 3 Encounters:  01/16/24 164 lb 9.6 oz (74.7 kg)  11/02/23 161 lb (73 kg)  09/26/23 162 lb (73.5 kg)    General:  NAD. Neck: No JVD or HJR. Lungs: Clear to auscultation bilaterally without wheezes or crackles. Heart: Regular rate and rhythm without murmurs, rubs, or gallops. Abdomen: Soft, nontender, nondistended. Extremities: No lower extremity edema.  ASSESSMENT AND PLAN: .    Palpitations and PSVT: Terri Andrade continues to have occasional palpitations with recent event monitor showing rare PACs, occasional PVCs, and a few brief supraventricular runs that could account for some of her symptoms.  Echo and CTA were reassuring without evidence of cardiomyopathy or obstructive CAD.  Given relatively mild symptoms, Terri Andrade does not wish to add any medications to suppress her ectopy.  Nonobstructive CAD and hyperlipidemia: Coronary CTA showed mild-moderate, nonobstructive CAD involving the proximal LAD.  I recommend continuation of aspirin and rosuvastatin .  We will check a lipid panel and ALT today to see how she is responding to rosuvastatin .  Dose escalation may be needed if her LDL remains above 70.     Dispo: Return to clinic as needed.  Signed, Lonni Hanson, MD

## 2024-01-17 ENCOUNTER — Ambulatory Visit: Payer: Self-pay

## 2024-01-17 ENCOUNTER — Encounter: Payer: Self-pay | Admitting: Internal Medicine

## 2024-01-17 DIAGNOSIS — I2510 Atherosclerotic heart disease of native coronary artery without angina pectoris: Secondary | ICD-10-CM | POA: Insufficient documentation

## 2024-01-17 DIAGNOSIS — Z79899 Other long term (current) drug therapy: Secondary | ICD-10-CM

## 2024-01-17 DIAGNOSIS — I471 Supraventricular tachycardia, unspecified: Secondary | ICD-10-CM | POA: Insufficient documentation

## 2024-01-17 LAB — LIPID PANEL
Chol/HDL Ratio: 3.7 ratio (ref 0.0–4.4)
Cholesterol, Total: 159 mg/dL (ref 100–199)
HDL: 43 mg/dL (ref >39)
LDL Chol Calc (NIH): 78 mg/dL (ref 0–99)
Triglycerides: 230 mg/dL — ABNORMAL HIGH (ref 0–149)
VLDL Cholesterol Cal: 38 mg/dL (ref 5–40)

## 2024-01-17 LAB — LIPOPROTEIN A (LPA): Lipoprotein (a): 149.6 nmol/L — ABNORMAL HIGH (ref <75.0)

## 2024-01-17 MED ORDER — ROSUVASTATIN CALCIUM 10 MG PO TABS: 10.0000 mg | ORAL_TABLET | Freq: Every day | ORAL | 3 refills | Status: AC

## 2024-02-22 ENCOUNTER — Ambulatory Visit: Admitting: Internal Medicine

## 2024-03-03 ENCOUNTER — Encounter: Payer: Self-pay | Admitting: Internal Medicine

## 2024-03-03 NOTE — Telephone Encounter (Signed)
 Called patient to assess her dizziness with taking 10 mg of rosuvastatin  - she stated that she is currently only taking the 1/2 tablet (5 mg) daily - recommended that she try 1/2 tablet in the morning and then 1/2 tablet in the evening, if this still makes her dizzy to drop back to just 5 mg daily and contact the office - patient verbalized understanding and agreement with the plan

## 2024-03-04 LAB — LIPID PANEL
Chol/HDL Ratio: 3.4 ratio (ref 0.0–4.4)
Cholesterol, Total: 138 mg/dL (ref 100–199)
HDL: 41 mg/dL (ref >39)
LDL Chol Calc (NIH): 60 mg/dL (ref 0–99)
Triglycerides: 232 mg/dL — ABNORMAL HIGH (ref 0–149)
VLDL Cholesterol Cal: 37 mg/dL (ref 5–40)

## 2024-03-04 LAB — ALT: ALT: 17 IU/L (ref 0–32)

## 2024-03-04 NOTE — Telephone Encounter (Signed)
 Please let Ms. Perdew know that it would be very unusual for her rosuvastatin  to cause dizziness and the low pressure.  However, she can try holding the medication for couple of days and seeing if her symptoms resolve.  If so, we may need to try alternative therapy, as it looks like her lipids have improved since going to 10 mg daily.  Lonni Hanson, MD [facility]

## 2024-04-07 ENCOUNTER — Other Ambulatory Visit: Payer: Self-pay | Admitting: Internal Medicine
